# Patient Record
Sex: Female | Born: 1965 | Race: White | Hispanic: No | Smoking: Current every day smoker
Health system: Southern US, Community
[De-identification: ages and names within clinical notes are randomized; demographics above are authoritative.]

## PROBLEM LIST (undated history)

## (undated) DIAGNOSIS — K5730 Diverticulosis of large intestine without perforation or abscess without bleeding: Secondary | ICD-10-CM

## (undated) DIAGNOSIS — F32A Depression, unspecified: Secondary | ICD-10-CM

## (undated) DIAGNOSIS — E0500 Thyrotoxicosis with diffuse goiter without thyrotoxic crisis or storm: Secondary | ICD-10-CM

## (undated) DIAGNOSIS — T7840XA Allergy, unspecified, initial encounter: Secondary | ICD-10-CM

## (undated) DIAGNOSIS — F419 Anxiety disorder, unspecified: Secondary | ICD-10-CM

## (undated) DIAGNOSIS — H05829 Myopathy of extraocular muscles, unspecified orbit: Secondary | ICD-10-CM

## (undated) DIAGNOSIS — F329 Major depressive disorder, single episode, unspecified: Secondary | ICD-10-CM

## (undated) DIAGNOSIS — E039 Hypothyroidism, unspecified: Secondary | ICD-10-CM

## (undated) HISTORY — PX: TONSILLECTOMY AND ADENOIDECTOMY: SUR1326

## (undated) HISTORY — PX: ELBOW SURGERY: SHX618

## (undated) HISTORY — DX: Allergy, unspecified, initial encounter: T78.40XA

## (undated) HISTORY — PX: COLONOSCOPY: SHX174

## (undated) HISTORY — DX: Hypothyroidism, unspecified: E03.9

## (undated) HISTORY — PX: HAND SURGERY: SHX662

## (undated) HISTORY — PX: CERVIX LESION DESTRUCTION: SHX591

## (undated) HISTORY — PX: EYE MUSCLE SURGERY: SHX370

---

## 1998-10-27 ENCOUNTER — Other Ambulatory Visit: Admission: RE | Admit: 1998-10-27 | Discharge: 1998-10-27 | Payer: Self-pay | Admitting: Family Medicine

## 1999-11-26 ENCOUNTER — Ambulatory Visit (HOSPITAL_COMMUNITY): Admission: RE | Admit: 1999-11-26 | Discharge: 1999-11-26 | Payer: Self-pay | Admitting: Family Medicine

## 1999-11-26 ENCOUNTER — Encounter: Payer: Self-pay | Admitting: Family Medicine

## 1999-12-21 ENCOUNTER — Ambulatory Visit (HOSPITAL_COMMUNITY): Admission: RE | Admit: 1999-12-21 | Discharge: 1999-12-21 | Payer: Self-pay | Admitting: *Deleted

## 2000-01-16 ENCOUNTER — Ambulatory Visit (HOSPITAL_COMMUNITY): Admission: RE | Admit: 2000-01-16 | Discharge: 2000-01-16 | Payer: Self-pay | Admitting: *Deleted

## 2001-06-30 ENCOUNTER — Other Ambulatory Visit: Admission: RE | Admit: 2001-06-30 | Discharge: 2001-06-30 | Payer: Self-pay | Admitting: Family Medicine

## 2002-11-16 ENCOUNTER — Other Ambulatory Visit: Admission: RE | Admit: 2002-11-16 | Discharge: 2002-11-16 | Payer: Self-pay | Admitting: Family Medicine

## 2003-12-30 ENCOUNTER — Other Ambulatory Visit: Admission: RE | Admit: 2003-12-30 | Discharge: 2003-12-30 | Payer: Self-pay | Admitting: Family Medicine

## 2010-03-18 ENCOUNTER — Encounter: Payer: Self-pay | Admitting: Family Medicine

## 2011-01-02 ENCOUNTER — Ambulatory Visit (HOSPITAL_COMMUNITY)
Admission: RE | Admit: 2011-01-02 | Discharge: 2011-01-02 | Disposition: A | Discharge status: Home / Self Care | Payer: Medicare Other | Source: Ambulatory Visit | Attending: Orthopedic Surgery | Admitting: Orthopedic Surgery

## 2011-01-02 DIAGNOSIS — I8290 Acute embolism and thrombosis of unspecified vein: Secondary | ICD-10-CM

## 2011-01-02 DIAGNOSIS — M79609 Pain in unspecified limb: Secondary | ICD-10-CM

## 2011-01-16 ENCOUNTER — Other Ambulatory Visit (HOSPITAL_COMMUNITY): Payer: Self-pay | Admitting: Orthopedic Surgery

## 2011-01-16 DIAGNOSIS — I731 Thromboangiitis obliterans [Buerger's disease]: Secondary | ICD-10-CM

## 2011-01-21 ENCOUNTER — Other Ambulatory Visit: Payer: Self-pay | Admitting: Neurology

## 2011-01-21 MED ORDER — SODIUM CHLORIDE 0.9 % IV SOLN: INTRAVENOUS | Status: DC

## 2011-01-22 ENCOUNTER — Other Ambulatory Visit: Payer: Self-pay | Admitting: Radiology

## 2011-01-22 ENCOUNTER — Ambulatory Visit (HOSPITAL_COMMUNITY): Admission: RE | Admit: 2011-01-22 | Payer: Medicare Other | Source: Ambulatory Visit

## 2011-01-23 ENCOUNTER — Other Ambulatory Visit: Payer: Self-pay | Admitting: Radiology

## 2011-01-23 MED ORDER — SODIUM CHLORIDE 0.9 % IV SOLN: INTRAVENOUS | Status: DC

## 2011-01-24 ENCOUNTER — Encounter (HOSPITAL_COMMUNITY): Payer: Self-pay

## 2011-01-24 ENCOUNTER — Ambulatory Visit (HOSPITAL_COMMUNITY)
Admission: RE | Admit: 2011-01-24 | Discharge: 2011-01-24 | Disposition: A | Discharge status: Home / Self Care | Payer: Medicare Other | Source: Ambulatory Visit | Attending: Orthopedic Surgery | Admitting: Orthopedic Surgery

## 2011-01-24 ENCOUNTER — Other Ambulatory Visit (HOSPITAL_COMMUNITY): Payer: Self-pay | Admitting: Orthopedic Surgery

## 2011-01-24 DIAGNOSIS — I731 Thromboangiitis obliterans [Buerger's disease]: Secondary | ICD-10-CM

## 2011-01-24 DIAGNOSIS — R209 Unspecified disturbances of skin sensation: Secondary | ICD-10-CM | POA: Insufficient documentation

## 2011-01-24 DIAGNOSIS — F17200 Nicotine dependence, unspecified, uncomplicated: Secondary | ICD-10-CM | POA: Insufficient documentation

## 2011-01-24 DIAGNOSIS — Z01812 Encounter for preprocedural laboratory examination: Secondary | ICD-10-CM | POA: Insufficient documentation

## 2011-01-24 DIAGNOSIS — M79609 Pain in unspecified limb: Secondary | ICD-10-CM | POA: Insufficient documentation

## 2011-01-24 HISTORY — DX: Major depressive disorder, single episode, unspecified: F32.9

## 2011-01-24 HISTORY — DX: Depression, unspecified: F32.A

## 2011-01-24 HISTORY — DX: Anxiety disorder, unspecified: F41.9

## 2011-01-24 HISTORY — DX: Myopathy of extraocular muscles, unspecified orbit: H05.829

## 2011-01-24 HISTORY — DX: Diverticulosis of large intestine without perforation or abscess without bleeding: K57.30

## 2011-01-24 HISTORY — DX: Thyrotoxicosis with diffuse goiter without thyrotoxic crisis or storm: E05.00

## 2011-01-24 LAB — BASIC METABOLIC PANEL
GFR calc Af Amer: 66 mL/min — ABNORMAL LOW (ref >90)
GFR calc non Af Amer: 57 mL/min — ABNORMAL LOW (ref >90)
Glucose, Bld: 85 mg/dL (ref 70–99)
Potassium: 3.8 mEq/L (ref 3.5–5.1)
Sodium: 140 mEq/L (ref 135–145)

## 2011-01-24 LAB — DIFFERENTIAL
Lymphs Abs: 1.4 10*3/uL (ref 0.7–4.0)
Monocytes Relative: 8 % (ref 3–12)
Neutro Abs: 4.7 10*3/uL (ref 1.7–7.7)
Neutrophils Relative %: 70 % (ref 43–77)

## 2011-01-24 LAB — CBC
Hemoglobin: 12.7 g/dL (ref 12.0–15.0)
MCH: 32.2 pg (ref 26.0–34.0)
RBC: 3.94 MIL/uL (ref 3.87–5.11)

## 2011-01-24 LAB — PROTIME-INR: INR: 0.96 (ref 0.00–1.49)

## 2011-01-24 MED ORDER — SODIUM CHLORIDE 0.9 % IV SOLN
INTRAVENOUS | Status: AC | PRN
  Administered 2011-01-24: 50 mL/h via INTRAVENOUS

## 2011-01-24 MED ORDER — MIDAZOLAM HCL 5 MG/5ML IJ SOLN
INTRAMUSCULAR | Status: AC | PRN
  Administered 2011-01-24 (×2): 2 mg via INTRAVENOUS

## 2011-01-24 MED ORDER — FENTANYL CITRATE 0.05 MG/ML IJ SOLN
INTRAMUSCULAR | Status: AC | PRN
  Administered 2011-01-24 (×2): 50 ug via INTRAVENOUS

## 2011-01-24 MED ORDER — FENTANYL CITRATE 0.05 MG/ML IJ SOLN
INTRAMUSCULAR | Status: AC
  Filled 2011-01-24: qty 6

## 2011-01-24 MED ORDER — IOHEXOL 300 MG/ML  SOLN
200.0000 mL | Freq: Once | INTRAMUSCULAR | Status: AC | PRN
  Administered 2011-01-24: 60 mL via INTRA_ARTERIAL

## 2011-01-24 MED ORDER — NITROGLYCERIN 1 MG/10 ML FOR IR/CATH LAB
INTRA_ARTERIAL | Status: AC
  Filled 2011-01-24: qty 10

## 2011-01-24 MED ORDER — MIDAZOLAM HCL 2 MG/2ML IJ SOLN
INTRAMUSCULAR | Status: AC
  Filled 2011-01-24: qty 6

## 2011-01-24 NOTE — Procedures (Signed)
LUE arteriogram No complication No blood loss. See complete dictation in [facility].

## 2011-01-24 NOTE — Progress Notes (Signed)
Pt transferred via stretcher to SS C-6. Call bell in reach. Activity order/bedrest order explained to pt.

## 2011-01-24 NOTE — H&P (Signed)
Roberta Brooks is an 45 y.o. female.   Chief Complaint: 3 month history of pain, numbness, weakness and discoloration of the left 4th and 5th fingers. HPI: Long time smoker. Increasing symptoms of the hand and fingers within last 3 months.  Patient presents today for angiogram to evaluate for possible Buerger's disease.  Past Medical History  Diagnosis Date  . Grave's disease   . Diverticulosis of colon   . Eye muscle weakness   . Depression   . Anxiety     Past Surgical History  Procedure Date  . Tonsillectomy and adenoidectomy   . Eye muscle surgery   . Colonoscopy   . Cervix lesion destruction     No family history on file. Social History:  reports that she has been smoking.  She does not have any smokeless tobacco history on file. She reports that she does not drink alcohol or use illicit drugs.  Allergies: No Known Allergies  No current outpatient prescriptions on file as of 01/24/2011.   Medications Prior to Admission  Medication Dose Route Frequency Provider Last Rate Last Dose  . DISCONTD: 0.9 %  sodium chloride infusion   Intravenous Continuous Robet Leu, PA        Results for orders placed during the hospital encounter of 01/24/11 (from the past 48 hour(s))  APTT     Status: Normal   Collection Time   01/24/11  7:56 AM      Component Value Range Comment   aPTT 29  24 - 37 (seconds)   CBC     Status: Normal   Collection Time   01/24/11  7:56 AM      Component Value Range Comment   WBC 6.7  4.0 - 10.5 (K/uL)    RBC 3.94  3.87 - 5.11 (MIL/uL)    Hemoglobin 12.7  12.0 - 15.0 (g/dL)    HCT 16.1  09.6 - 04.5 (%)    MCV 94.9  78.0 - 100.0 (fL)    MCH 32.2  26.0 - 34.0 (pg)    MCHC 34.0  30.0 - 36.0 (g/dL)    RDW 40.9  81.1 - 91.4 (%)    Platelets 167  150 - 400 (K/uL)   DIFFERENTIAL     Status: Normal   Collection Time   01/24/11  7:56 AM      Component Value Range Comment   Neutrophils Relative 70  43 - 77 (%)    Neutro Abs 4.7  1.7 - 7.7 (K/uL)    Lymphocytes Relative 21  12 - 46 (%)    Lymphs Abs 1.4  0.7 - 4.0 (K/uL)    Monocytes Relative 8  3 - 12 (%)    Monocytes Absolute 0.6  0.1 - 1.0 (K/uL)    Eosinophils Relative 0  0 - 5 (%)    Eosinophils Absolute 0.0  0.0 - 0.7 (K/uL)    Basophils Relative 0  0 - 1 (%)    Basophils Absolute 0.0  0.0 - 0.1 (K/uL)   PROTIME-INR     Status: Normal   Collection Time   01/24/11  7:56 AM      Component Value Range Comment   Prothrombin Time 13.0  11.6 - 15.2 (seconds)    INR 0.96  0.00 - 1.49     No results found.  Review of Systems  Constitutional: Negative for fever, chills and malaise/fatigue.  HENT: Negative.   Eyes: Negative.   Respiratory: Negative.   Cardiovascular: Negative.  Gastrointestinal: Negative.   Genitourinary: Negative.   Musculoskeletal: Negative.   Skin: Negative.   Neurological: Positive for sensory change and focal weakness. Negative for weakness.       Involving left upper extremity  Endo/Heme/Allergies: Negative.   Psychiatric/Behavioral: Positive for depression.    Blood pressure 127/65, pulse 88, temperature 97.3 F (36.3 C), temperature source Oral, resp. rate 18, height 5\' 3"  (1.6 m), weight 115 lb (52.164 kg), SpO2 100.00%. Physical Exam  Constitutional: She is oriented to person, place, and time. She appears well-developed and well-nourished.  HENT:  Head: Normocephalic and atraumatic.  Eyes: EOM are normal. Pupils are equal, round, and reactive to light.  Neck: Normal range of motion.  Cardiovascular: Normal rate, regular rhythm and normal heart sounds.  Exam reveals no gallop and no friction rub.   No murmur heard. Respiratory: Effort normal and breath sounds normal. No respiratory distress. She has no wheezes. She has no rales.  GI: Soft. Bowel sounds are normal. She exhibits no distension. There is no tenderness.  Musculoskeletal: She exhibits tenderness. She exhibits no edema.       Left 4th and 5th fingers unable to extend completely,  slightly blue tinged, painful on exam with decreased sensation. Decreased pulses in ulnar artery, radial artery easily palpable.   Neurological: She is alert and oriented to person, place, and time.  Skin: Skin is warm and dry.  Psychiatric: She has a normal mood and affect. Her behavior is normal. Judgment and thought content normal.     Assessment/Plan Diminished ulnar artery with painful left hand worrisome for Buergers disease.  Patient has been counseled on smoking and it's effects if diagnosis determined on today's study.  Patient was informed of arteriogram procedure and risks associated with same including but not limited to infection, bleeding, contrast allergy, complications with moderate sedation and increased pain.  Patient's questions answered to her satisfaction and written consent obtained.  Labs all within normal limits to proceed.  Irmgard Rampersaud D 01/24/2011, 8:48 AM

## 2011-03-11 DIAGNOSIS — G609 Hereditary and idiopathic neuropathy, unspecified: Secondary | ICD-10-CM | POA: Diagnosis not present

## 2011-03-26 DIAGNOSIS — G5620 Lesion of ulnar nerve, unspecified upper limb: Secondary | ICD-10-CM | POA: Diagnosis not present

## 2011-03-28 DIAGNOSIS — N390 Urinary tract infection, site not specified: Secondary | ICD-10-CM | POA: Diagnosis not present

## 2011-03-28 DIAGNOSIS — E039 Hypothyroidism, unspecified: Secondary | ICD-10-CM | POA: Diagnosis not present

## 2011-03-28 DIAGNOSIS — E785 Hyperlipidemia, unspecified: Secondary | ICD-10-CM | POA: Diagnosis not present

## 2011-03-28 DIAGNOSIS — F3130 Bipolar disorder, current episode depressed, mild or moderate severity, unspecified: Secondary | ICD-10-CM | POA: Diagnosis not present

## 2011-04-03 DIAGNOSIS — G5620 Lesion of ulnar nerve, unspecified upper limb: Secondary | ICD-10-CM | POA: Diagnosis not present

## 2011-04-29 DIAGNOSIS — E890 Postprocedural hypothyroidism: Secondary | ICD-10-CM | POA: Diagnosis not present

## 2011-04-29 DIAGNOSIS — E0500 Thyrotoxicosis with diffuse goiter without thyrotoxic crisis or storm: Secondary | ICD-10-CM | POA: Diagnosis not present

## 2011-05-02 DIAGNOSIS — M24549 Contracture, unspecified hand: Secondary | ICD-10-CM | POA: Diagnosis not present

## 2011-05-02 DIAGNOSIS — M25649 Stiffness of unspecified hand, not elsewhere classified: Secondary | ICD-10-CM | POA: Diagnosis not present

## 2011-05-02 DIAGNOSIS — M79609 Pain in unspecified limb: Secondary | ICD-10-CM | POA: Diagnosis not present

## 2011-05-02 DIAGNOSIS — G5620 Lesion of ulnar nerve, unspecified upper limb: Secondary | ICD-10-CM | POA: Diagnosis not present

## 2011-05-06 DIAGNOSIS — G5620 Lesion of ulnar nerve, unspecified upper limb: Secondary | ICD-10-CM | POA: Diagnosis not present

## 2011-05-06 DIAGNOSIS — M79609 Pain in unspecified limb: Secondary | ICD-10-CM | POA: Diagnosis not present

## 2011-05-06 DIAGNOSIS — M24549 Contracture, unspecified hand: Secondary | ICD-10-CM | POA: Diagnosis not present

## 2011-05-06 DIAGNOSIS — M25649 Stiffness of unspecified hand, not elsewhere classified: Secondary | ICD-10-CM | POA: Diagnosis not present

## 2011-05-09 DIAGNOSIS — G5620 Lesion of ulnar nerve, unspecified upper limb: Secondary | ICD-10-CM | POA: Diagnosis not present

## 2011-05-09 DIAGNOSIS — M79609 Pain in unspecified limb: Secondary | ICD-10-CM | POA: Diagnosis not present

## 2011-05-09 DIAGNOSIS — M25649 Stiffness of unspecified hand, not elsewhere classified: Secondary | ICD-10-CM | POA: Diagnosis not present

## 2011-05-09 DIAGNOSIS — M24549 Contracture, unspecified hand: Secondary | ICD-10-CM | POA: Diagnosis not present

## 2011-05-14 DIAGNOSIS — M25649 Stiffness of unspecified hand, not elsewhere classified: Secondary | ICD-10-CM | POA: Diagnosis not present

## 2011-05-14 DIAGNOSIS — M79609 Pain in unspecified limb: Secondary | ICD-10-CM | POA: Diagnosis not present

## 2011-05-14 DIAGNOSIS — G5620 Lesion of ulnar nerve, unspecified upper limb: Secondary | ICD-10-CM | POA: Diagnosis not present

## 2011-05-14 DIAGNOSIS — M24549 Contracture, unspecified hand: Secondary | ICD-10-CM | POA: Diagnosis not present

## 2011-05-16 DIAGNOSIS — G5620 Lesion of ulnar nerve, unspecified upper limb: Secondary | ICD-10-CM | POA: Diagnosis not present

## 2011-05-16 DIAGNOSIS — M79609 Pain in unspecified limb: Secondary | ICD-10-CM | POA: Diagnosis not present

## 2011-05-16 DIAGNOSIS — M25649 Stiffness of unspecified hand, not elsewhere classified: Secondary | ICD-10-CM | POA: Diagnosis not present

## 2011-05-16 DIAGNOSIS — M24549 Contracture, unspecified hand: Secondary | ICD-10-CM | POA: Diagnosis not present

## 2011-05-23 DIAGNOSIS — M25649 Stiffness of unspecified hand, not elsewhere classified: Secondary | ICD-10-CM | POA: Diagnosis not present

## 2011-05-23 DIAGNOSIS — M79609 Pain in unspecified limb: Secondary | ICD-10-CM | POA: Diagnosis not present

## 2011-05-23 DIAGNOSIS — G5620 Lesion of ulnar nerve, unspecified upper limb: Secondary | ICD-10-CM | POA: Diagnosis not present

## 2011-05-23 DIAGNOSIS — M24549 Contracture, unspecified hand: Secondary | ICD-10-CM | POA: Diagnosis not present

## 2011-05-27 DIAGNOSIS — G5620 Lesion of ulnar nerve, unspecified upper limb: Secondary | ICD-10-CM | POA: Diagnosis not present

## 2011-05-27 DIAGNOSIS — M24549 Contracture, unspecified hand: Secondary | ICD-10-CM | POA: Diagnosis not present

## 2011-05-27 DIAGNOSIS — M25649 Stiffness of unspecified hand, not elsewhere classified: Secondary | ICD-10-CM | POA: Diagnosis not present

## 2011-05-27 DIAGNOSIS — M79609 Pain in unspecified limb: Secondary | ICD-10-CM | POA: Diagnosis not present

## 2011-05-30 DIAGNOSIS — M25649 Stiffness of unspecified hand, not elsewhere classified: Secondary | ICD-10-CM | POA: Diagnosis not present

## 2011-05-30 DIAGNOSIS — M24549 Contracture, unspecified hand: Secondary | ICD-10-CM | POA: Diagnosis not present

## 2011-05-30 DIAGNOSIS — G5620 Lesion of ulnar nerve, unspecified upper limb: Secondary | ICD-10-CM | POA: Diagnosis not present

## 2011-05-30 DIAGNOSIS — M79609 Pain in unspecified limb: Secondary | ICD-10-CM | POA: Diagnosis not present

## 2011-06-04 DIAGNOSIS — M25649 Stiffness of unspecified hand, not elsewhere classified: Secondary | ICD-10-CM | POA: Diagnosis not present

## 2011-06-04 DIAGNOSIS — M24549 Contracture, unspecified hand: Secondary | ICD-10-CM | POA: Diagnosis not present

## 2011-06-04 DIAGNOSIS — G5620 Lesion of ulnar nerve, unspecified upper limb: Secondary | ICD-10-CM | POA: Diagnosis not present

## 2011-06-04 DIAGNOSIS — M79609 Pain in unspecified limb: Secondary | ICD-10-CM | POA: Diagnosis not present

## 2011-06-10 DIAGNOSIS — M79609 Pain in unspecified limb: Secondary | ICD-10-CM | POA: Diagnosis not present

## 2011-06-10 DIAGNOSIS — G5620 Lesion of ulnar nerve, unspecified upper limb: Secondary | ICD-10-CM | POA: Diagnosis not present

## 2011-06-10 DIAGNOSIS — M24549 Contracture, unspecified hand: Secondary | ICD-10-CM | POA: Diagnosis not present

## 2011-06-10 DIAGNOSIS — M25649 Stiffness of unspecified hand, not elsewhere classified: Secondary | ICD-10-CM | POA: Diagnosis not present

## 2011-07-05 DIAGNOSIS — G5620 Lesion of ulnar nerve, unspecified upper limb: Secondary | ICD-10-CM | POA: Diagnosis not present

## 2011-07-09 DIAGNOSIS — E039 Hypothyroidism, unspecified: Secondary | ICD-10-CM | POA: Diagnosis not present

## 2011-07-09 DIAGNOSIS — N3942 Incontinence without sensory awareness: Secondary | ICD-10-CM | POA: Diagnosis not present

## 2011-07-09 DIAGNOSIS — R3589 Other polyuria: Secondary | ICD-10-CM | POA: Diagnosis not present

## 2011-07-09 DIAGNOSIS — R358 Other polyuria: Secondary | ICD-10-CM | POA: Diagnosis not present

## 2011-08-26 DIAGNOSIS — N3946 Mixed incontinence: Secondary | ICD-10-CM | POA: Diagnosis not present

## 2011-08-30 DIAGNOSIS — G5620 Lesion of ulnar nerve, unspecified upper limb: Secondary | ICD-10-CM | POA: Diagnosis not present

## 2011-09-05 DIAGNOSIS — N3946 Mixed incontinence: Secondary | ICD-10-CM | POA: Diagnosis not present

## 2011-09-26 DIAGNOSIS — N3946 Mixed incontinence: Secondary | ICD-10-CM | POA: Diagnosis not present

## 2011-11-05 DIAGNOSIS — E0500 Thyrotoxicosis with diffuse goiter without thyrotoxic crisis or storm: Secondary | ICD-10-CM | POA: Diagnosis not present

## 2011-11-05 DIAGNOSIS — E890 Postprocedural hypothyroidism: Secondary | ICD-10-CM | POA: Diagnosis not present

## 2011-11-07 DIAGNOSIS — E0500 Thyrotoxicosis with diffuse goiter without thyrotoxic crisis or storm: Secondary | ICD-10-CM | POA: Diagnosis not present

## 2011-11-07 DIAGNOSIS — E890 Postprocedural hypothyroidism: Secondary | ICD-10-CM | POA: Diagnosis not present

## 2011-11-13 DIAGNOSIS — N3946 Mixed incontinence: Secondary | ICD-10-CM | POA: Diagnosis not present

## 2011-11-20 DIAGNOSIS — N3644 Muscular disorders of urethra: Secondary | ICD-10-CM | POA: Diagnosis not present

## 2011-11-20 DIAGNOSIS — N318 Other neuromuscular dysfunction of bladder: Secondary | ICD-10-CM | POA: Diagnosis not present

## 2011-11-20 DIAGNOSIS — N3946 Mixed incontinence: Secondary | ICD-10-CM | POA: Diagnosis not present

## 2011-12-30 ENCOUNTER — Ambulatory Visit: Payer: Medicare Other | Attending: Urology | Admitting: Physical Therapy

## 2011-12-30 DIAGNOSIS — M2420 Disorder of ligament, unspecified site: Secondary | ICD-10-CM | POA: Insufficient documentation

## 2011-12-30 DIAGNOSIS — IMO0001 Reserved for inherently not codable concepts without codable children: Secondary | ICD-10-CM | POA: Insufficient documentation

## 2011-12-30 DIAGNOSIS — M629 Disorder of muscle, unspecified: Secondary | ICD-10-CM | POA: Insufficient documentation

## 2012-01-14 ENCOUNTER — Ambulatory Visit: Payer: Medicare Other | Admitting: Physical Therapy

## 2012-01-14 DIAGNOSIS — M629 Disorder of muscle, unspecified: Secondary | ICD-10-CM | POA: Diagnosis not present

## 2012-01-14 DIAGNOSIS — IMO0001 Reserved for inherently not codable concepts without codable children: Secondary | ICD-10-CM | POA: Diagnosis not present

## 2012-01-15 ENCOUNTER — Ambulatory Visit: Payer: Medicare Other | Admitting: Physical Therapy

## 2012-01-16 ENCOUNTER — Ambulatory Visit: Payer: Medicare Other | Admitting: Physical Therapy

## 2012-01-20 ENCOUNTER — Ambulatory Visit: Payer: Medicare Other | Admitting: Physical Therapy

## 2012-01-21 ENCOUNTER — Ambulatory Visit: Payer: Medicare Other | Admitting: Physical Therapy

## 2012-01-21 DIAGNOSIS — R05 Cough: Secondary | ICD-10-CM | POA: Diagnosis not present

## 2012-01-21 DIAGNOSIS — R059 Cough, unspecified: Secondary | ICD-10-CM | POA: Diagnosis not present

## 2012-01-21 DIAGNOSIS — J069 Acute upper respiratory infection, unspecified: Secondary | ICD-10-CM | POA: Diagnosis not present

## 2012-01-27 ENCOUNTER — Ambulatory Visit: Payer: Medicare Other | Attending: Urology | Admitting: Physical Therapy

## 2012-01-27 DIAGNOSIS — M2420 Disorder of ligament, unspecified site: Secondary | ICD-10-CM | POA: Insufficient documentation

## 2012-01-27 DIAGNOSIS — M629 Disorder of muscle, unspecified: Secondary | ICD-10-CM | POA: Diagnosis not present

## 2012-01-27 DIAGNOSIS — IMO0001 Reserved for inherently not codable concepts without codable children: Secondary | ICD-10-CM | POA: Insufficient documentation

## 2012-01-29 ENCOUNTER — Ambulatory Visit: Payer: Medicare Other | Admitting: Physical Therapy

## 2012-02-03 ENCOUNTER — Ambulatory Visit: Payer: Medicare Other | Admitting: Physical Therapy

## 2012-02-05 ENCOUNTER — Encounter: Payer: Medicare Other | Admitting: Physical Therapy

## 2012-02-10 ENCOUNTER — Encounter: Payer: Medicare Other | Admitting: Physical Therapy

## 2012-02-12 ENCOUNTER — Encounter: Payer: Medicare Other | Admitting: Physical Therapy

## 2012-02-17 ENCOUNTER — Encounter: Payer: Medicare Other | Admitting: Physical Therapy

## 2012-02-18 ENCOUNTER — Encounter: Payer: Medicare Other | Admitting: Physical Therapy

## 2012-02-24 ENCOUNTER — Encounter: Payer: Medicare Other | Admitting: Physical Therapy

## 2012-03-30 DIAGNOSIS — E559 Vitamin D deficiency, unspecified: Secondary | ICD-10-CM | POA: Diagnosis not present

## 2012-03-30 DIAGNOSIS — R5383 Other fatigue: Secondary | ICD-10-CM | POA: Diagnosis not present

## 2012-03-30 DIAGNOSIS — R109 Unspecified abdominal pain: Secondary | ICD-10-CM | POA: Diagnosis not present

## 2012-03-30 DIAGNOSIS — K2900 Acute gastritis without bleeding: Secondary | ICD-10-CM | POA: Diagnosis not present

## 2012-03-30 DIAGNOSIS — D510 Vitamin B12 deficiency anemia due to intrinsic factor deficiency: Secondary | ICD-10-CM | POA: Diagnosis not present

## 2012-03-30 DIAGNOSIS — R5381 Other malaise: Secondary | ICD-10-CM | POA: Diagnosis not present

## 2012-03-30 DIAGNOSIS — E785 Hyperlipidemia, unspecified: Secondary | ICD-10-CM | POA: Diagnosis not present

## 2012-03-30 DIAGNOSIS — F3289 Other specified depressive episodes: Secondary | ICD-10-CM | POA: Diagnosis not present

## 2012-03-30 DIAGNOSIS — F329 Major depressive disorder, single episode, unspecified: Secondary | ICD-10-CM | POA: Diagnosis not present

## 2012-07-01 ENCOUNTER — Other Ambulatory Visit: Payer: Self-pay

## 2012-07-01 MED ORDER — ALPRAZOLAM 0.5 MG PO TABS: 0.5000 mg | ORAL_TABLET | Freq: Three times a day (TID) | ORAL | Status: DC | PRN

## 2012-07-01 NOTE — Telephone Encounter (Signed)
Last written 04/21/12  Last seen 03/30/12   If approved call in and have nurse notify patient

## 2012-07-01 NOTE — Telephone Encounter (Signed)
Please call in RX for xanax 

## 2012-07-01 NOTE — Telephone Encounter (Signed)
Med called in pt aware

## 2012-08-06 ENCOUNTER — Other Ambulatory Visit: Payer: Self-pay | Admitting: Nurse Practitioner

## 2012-08-25 ENCOUNTER — Other Ambulatory Visit: Payer: Self-pay | Admitting: Nurse Practitioner

## 2012-08-25 NOTE — Telephone Encounter (Signed)
Last seen 03/30/12   MMM If approved print and have nurse call patient to pick up

## 2012-08-25 NOTE — Telephone Encounter (Signed)
rx ready for pickup 

## 2012-08-26 NOTE — Telephone Encounter (Signed)
[facility] 

## 2012-09-10 ENCOUNTER — Other Ambulatory Visit: Payer: Self-pay | Admitting: Nurse Practitioner

## 2012-09-11 ENCOUNTER — Other Ambulatory Visit: Payer: Self-pay | Admitting: Nurse Practitioner

## 2012-09-11 NOTE — Telephone Encounter (Signed)
Last TSH said to recheck in 6 weeks, that was 04/09/12 and pt has not been back?

## 2012-09-16 ENCOUNTER — Ambulatory Visit (INDEPENDENT_AMBULATORY_CARE_PROVIDER_SITE_OTHER): Payer: Medicare Other | Admitting: Family Medicine

## 2012-09-16 ENCOUNTER — Encounter: Payer: Self-pay | Admitting: Family Medicine

## 2012-09-16 VITALS — BP 109/70 | HR 81 | Temp 97.7°F | Ht 64.0 in | Wt 89.0 lb

## 2012-09-16 DIAGNOSIS — F3289 Other specified depressive episodes: Secondary | ICD-10-CM | POA: Diagnosis not present

## 2012-09-16 DIAGNOSIS — R232 Flushing: Secondary | ICD-10-CM

## 2012-09-16 DIAGNOSIS — F32A Depression, unspecified: Secondary | ICD-10-CM | POA: Insufficient documentation

## 2012-09-16 DIAGNOSIS — E039 Hypothyroidism, unspecified: Secondary | ICD-10-CM

## 2012-09-16 DIAGNOSIS — N951 Menopausal and female climacteric states: Secondary | ICD-10-CM | POA: Diagnosis not present

## 2012-09-16 DIAGNOSIS — R5381 Other malaise: Secondary | ICD-10-CM

## 2012-09-16 DIAGNOSIS — N912 Amenorrhea, unspecified: Secondary | ICD-10-CM | POA: Diagnosis not present

## 2012-09-16 DIAGNOSIS — R5383 Other fatigue: Secondary | ICD-10-CM | POA: Diagnosis not present

## 2012-09-16 DIAGNOSIS — F329 Major depressive disorder, single episode, unspecified: Secondary | ICD-10-CM | POA: Diagnosis not present

## 2012-09-16 HISTORY — DX: Hypothyroidism, unspecified: E03.9

## 2012-09-16 LAB — POCT CBC
Granulocyte percent: 61.9 %G (ref 37–80)
HCT, POC: 42.4 % (ref 37.7–47.9)
Hemoglobin: 14.4 g/dL (ref 12.2–16.2)
Lymph, poc: 2.1 (ref 0.6–3.4)
MCH, POC: 32.8 pg — AB (ref 27–31.2)
MCHC: 34 g/dL (ref 31.8–35.4)
MCV: 96.4 fL (ref 80–97)
MPV: 8.1 fL (ref 0–99.8)
POC Granulocyte: 3.7 (ref 2–6.9)
POC LYMPH PERCENT: 34.7 %L (ref 10–50)
Platelet Count, POC: 158 10*3/uL (ref 142–424)
RBC: 4.4 M/uL (ref 4.04–5.48)
RDW, POC: 12.4 %
WBC: 6 10*3/uL (ref 4.6–10.2)

## 2012-09-16 MED ORDER — LEVOTHYROXINE SODIUM 75 MCG PO TABS: 75.0000 ug | ORAL_TABLET | Freq: Every day | ORAL | Status: DC

## 2012-09-16 MED ORDER — VENLAFAXINE HCL ER 37.5 MG PO CP24: ORAL_CAPSULE | ORAL | Status: DC

## 2012-09-16 MED ORDER — VENLAFAXINE HCL ER 75 MG PO CP24: 75.0000 mg | ORAL_CAPSULE | Freq: Every day | ORAL | Status: DC
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
## 2012-09-16 NOTE — Patient Instructions (Addendum)
Depression, Adult Depression refers to feeling sad, low, down in the dumps, blue, gloomy, or empty. In general, there are two kinds of depression: 1. Depression that we all experience from time to time because of upsetting life experiences, including the loss of a job or the ending of a relationship (normal sadness or normal grief). This kind of depression is considered normal, is short lived, and resolves within a few days to 2 weeks. (Depression experienced after the loss of a loved one is called bereavement. Bereavement often lasts longer than 2 weeks but normally gets better with time.) 2. Clinical depression, which lasts longer than normal sadness or normal grief or interferes with your ability to function at home, at work, and in school. It also interferes with your personal relationships. It affects almost every aspect of your life. Clinical depression is an illness. Symptoms of depression also can be caused by conditions other than normal sadness and grief or clinical depression. Examples of these conditions are listed as follows:  Physical illness Some physical illnesses, including underactive thyroid gland (hypothyroidism), severe anemia, specific types of cancer, diabetes, uncontrolled seizures, heart and lung problems, strokes, and chronic pain are commonly associated with symptoms of depression.  Side effects of some prescription medicine In some people, certain types of prescription medicine can cause symptoms of depression.  Substance abuse Abuse of alcohol and illicit drugs can cause symptoms of depression. SYMPTOMS Symptoms of normal sadness and normal grief include the following:  Feeling sad or crying for short periods of time.  Not caring about anything (apathy).  Difficulty sleeping or sleeping too much.  No longer able to enjoy the things you used to enjoy.  Desire to be by oneself all the time (social isolation).  Lack of energy or motivation.  Difficulty  concentrating or remembering.  Change in appetite or weight.  Restlessness or agitation. Symptoms of clinical depression include the same symptoms of normal sadness or normal grief and also the following symptoms:  Feeling sad or crying all the time.  Feelings of guilt or worthlessness.  Feelings of hopelessness or helplessness.  Thoughts of suicide or the desire to harm yourself (suicidal ideation).  Loss of touch with reality (psychotic symptoms). Seeing or hearing things that are not real (hallucinations) or having false beliefs about your life or the people around you (delusions and paranoia). DIAGNOSIS  The diagnosis of clinical depression usually is based on the severity and duration of the symptoms. Your caregiver also will ask you questions about your medical history and substance use to find out if physical illness, use of prescription medicine, or substance abuse is causing your depression. Your caregiver also may order blood tests. TREATMENT  Typically, normal sadness and normal grief do not require treatment. However, sometimes antidepressant medicine is prescribed for bereavement to ease the depressive symptoms until they resolve. The treatment for clinical depression depends on the severity of your symptoms but typically includes antidepressant medicine, counseling with a mental health professional, or a combination of both. Your caregiver will help to determine what treatment is best for you. Depression caused by physical illness usually goes away with appropriate medical treatment of the illness. If prescription medicine is causing depression, talk with your caregiver about stopping the medicine, decreasing the dose, or substituting another medicine. Depression caused by abuse of alcohol or illicit drugs abuse goes away with abstinence from these substances. Some adults need professional help in order to stop drinking or using drugs. SEEK IMMEDIATE CARE IF:  You have   thoughts  about hurting yourself or others.  You lose touch with reality (have psychotic symptoms).  You are taking medicine for depression and have a serious side effect. FOR MORE INFORMATION National Alliance on Mental Illness: www.nami.[employer]: http://www.maynard.net/ Document Released: 02/09/2000 Document Revised: 08/13/2011 Document Reviewed: 05/13/2011 [facility] Patient Information 2014 [place], [place]. Depression, Adult Depression refers to feeling sad, low, down in the dumps, blue, gloomy, or empty. In general, there are two kinds of depression: 3. Depression that we all experience from time to time because of upsetting life experiences, including the loss of a job or the ending of a relationship (normal sadness or normal grief). This kind of depression is considered normal, is short lived, and resolves within a few days to 2 weeks. (Depression experienced after the loss of a loved one is called bereavement. Bereavement often lasts longer than 2 weeks but normally gets better with time.) 4. Clinical depression, which lasts longer than normal sadness or normal grief or interferes with your ability to function at home, at work, and in school. It also interferes with your personal relationships. It affects almost every aspect of your life. Clinical depression is an illness. Symptoms of depression also can be caused by conditions other than normal sadness and grief or clinical depression. Examples of these conditions are listed as follows:  Physical illness Some physical illnesses, including underactive thyroid gland (hypothyroidism), severe anemia, specific types of cancer, diabetes, uncontrolled seizures, heart and lung problems, strokes, and chronic pain are commonly associated with symptoms of depression.  Side effects of some prescription medicine In some people, certain types of prescription medicine can cause symptoms of depression.  Substance abuse Abuse of alcohol and  illicit drugs can cause symptoms of depression. SYMPTOMS Symptoms of normal sadness and normal grief include the following:  Feeling sad or crying for short periods of time.  Not caring about anything (apathy).  Difficulty sleeping or sleeping too much.  No longer able to enjoy the things you used to enjoy.  Desire to be by oneself all the time (social isolation).  Lack of energy or motivation.  Difficulty concentrating or remembering.  Change in appetite or weight.  Restlessness or agitation. Symptoms of clinical depression include the same symptoms of normal sadness or normal grief and also the following symptoms:  Feeling sad or crying all the time.  Feelings of guilt or worthlessness.  Feelings of hopelessness or helplessness.  Thoughts of suicide or the desire to harm yourself (suicidal ideation).  Loss of touch with reality (psychotic symptoms). Seeing or hearing things that are not real (hallucinations) or having false beliefs about your life or the people around you (delusions and paranoia). DIAGNOSIS  The diagnosis of clinical depression usually is based on the severity and duration of the symptoms. Your caregiver also will ask you questions about your medical history and substance use to find out if physical illness, use of prescription medicine, or substance abuse is causing your depression. Your caregiver also may order blood tests. TREATMENT  Typically, normal sadness and normal grief do not require treatment. However, sometimes antidepressant medicine is prescribed for bereavement to ease the depressive symptoms until they resolve. The treatment for clinical depression depends on the severity of your symptoms but typically includes antidepressant medicine, counseling with a mental health professional, or a combination of both. Your caregiver will help to determine what treatment is best for you. Depression caused by physical illness usually goes away with  appropriate medical treatment of  the illness. If prescription medicine is causing depression, talk with your caregiver about stopping the medicine, decreasing the dose, or substituting another medicine. Depression caused by abuse of alcohol or illicit drugs abuse goes away with abstinence from these substances. Some adults need professional help in order to stop drinking or using drugs. SEEK IMMEDIATE CARE IF:  You have thoughts about hurting yourself or others.  You lose touch with reality (have psychotic symptoms).  You are taking medicine for depression and have a serious side effect. FOR MORE INFORMATION National Alliance on Mental Illness: www.nami.[employer]: http://www.maynard.net/ Document Released: 02/09/2000 Document Revised: 08/13/2011 Document Reviewed: 05/13/2011 [facility] Patient Information 2014 [place], [place].

## 2012-09-17 LAB — COMPLETE METABOLIC PANEL WITH GFR
ALT: 11 U/L (ref 0–35)
AST: 18 U/L (ref 0–37)
Albumin: 4.6 g/dL (ref 3.5–5.2)
Alkaline Phosphatase: 61 U/L (ref 39–117)
BUN: 19 mg/dL (ref 6–23)
CO2: 28 mEq/L (ref 19–32)
Calcium: 9.9 mg/dL (ref 8.4–10.5)
Chloride: 104 mEq/L (ref 96–112)
Creat: 1.16 mg/dL — ABNORMAL HIGH (ref 0.50–1.10)
GFR, Est African American: 64 mL/min
GFR, Est Non African American: 56 mL/min — ABNORMAL LOW
Glucose, Bld: 84 mg/dL (ref 70–99)
Potassium: 4.2 mEq/L (ref 3.5–5.3)
Sodium: 141 mEq/L (ref 135–145)
Total Bilirubin: 0.7 mg/dL (ref 0.3–1.2)
Total Protein: 7.1 g/dL (ref 6.0–8.3)

## 2012-09-17 LAB — THYROID PANEL WITH TSH
Free Thyroxine Index: 4.3 — ABNORMAL HIGH (ref 1.0–3.9)
T3 Uptake: 32.7 % (ref 22.5–37.0)
T4, Total: 13.2 ug/dL — ABNORMAL HIGH (ref 5.0–12.5)
TSH: 0.155 u[IU]/mL — ABNORMAL LOW (ref 0.350–4.500)

## 2012-09-17 LAB — FOLLICLE STIMULATING HORMONE: FSH: 142 m[IU]/mL — ABNORMAL HIGH

## 2012-09-18 ENCOUNTER — Other Ambulatory Visit: Payer: Self-pay | Admitting: Family Medicine

## 2012-09-18 DIAGNOSIS — E039 Hypothyroidism, unspecified: Secondary | ICD-10-CM

## 2012-09-18 MED ORDER — LEVOTHYROXINE SODIUM 50 MCG PO TABS: 50.0000 ug | ORAL_TABLET | Freq: Every day | ORAL | Status: DC

## 2012-09-21 ENCOUNTER — Telehealth: Payer: Self-pay | Admitting: Family Medicine

## 2012-09-22 NOTE — Telephone Encounter (Signed)
Patient aware please review FSH and tell her if she in in menopause

## 2012-09-22 NOTE — Telephone Encounter (Signed)
FSH is high so she is menopausal.  Also I adjusted her thyroid medicine and she needs to follow up 6 weeks after taking new thyroid medicine in 6 weeks to repeat TSH.

## 2012-09-23 NOTE — Telephone Encounter (Signed)
Patient aware.

## 2012-11-08 ENCOUNTER — Other Ambulatory Visit: Payer: Self-pay | Admitting: Nurse Practitioner

## 2012-11-25 ENCOUNTER — Other Ambulatory Visit: Payer: Self-pay | Admitting: Nurse Practitioner

## 2012-11-27 ENCOUNTER — Other Ambulatory Visit: Payer: Self-pay | Admitting: Family Medicine

## 2012-11-27 MED ORDER — ALPRAZOLAM 0.5 MG PO TABS: 0.5000 mg | ORAL_TABLET | Freq: Three times a day (TID) | ORAL | Status: DC | PRN

## 2012-11-27 NOTE — Telephone Encounter (Signed)
Last filled 10/26/12, last seen 08/27/12. Route to Pool A so [facility]

## 2012-11-27 NOTE — Telephone Encounter (Signed)
Left authorization on pharmacy voicemail. 

## 2012-12-02 ENCOUNTER — Telehealth: Payer: Self-pay | Admitting: Nurse Practitioner

## 2012-12-02 ENCOUNTER — Other Ambulatory Visit: Payer: Self-pay | Admitting: Nurse Practitioner

## 2012-12-02 MED ORDER — HYDROXYZINE HCL 25 MG PO TABS: 25.0000 mg | ORAL_TABLET | Freq: Three times a day (TID) | ORAL | Status: DC | PRN

## 2012-12-02 NOTE — Telephone Encounter (Signed)
Called in.

## 2012-12-02 NOTE — Telephone Encounter (Signed)
Hydroxyzine rx sent to pharmacy

## 2012-12-15 ENCOUNTER — Ambulatory Visit (INDEPENDENT_AMBULATORY_CARE_PROVIDER_SITE_OTHER): Payer: Medicare Other | Admitting: Family Medicine

## 2012-12-15 VITALS — BP 128/63 | HR 70 | Temp 98.6°F | Ht 64.0 in | Wt 95.0 lb

## 2012-12-15 DIAGNOSIS — R21 Rash and other nonspecific skin eruption: Secondary | ICD-10-CM | POA: Diagnosis not present

## 2012-12-15 DIAGNOSIS — M79641 Pain in right hand: Secondary | ICD-10-CM

## 2012-12-15 DIAGNOSIS — M79609 Pain in unspecified limb: Secondary | ICD-10-CM | POA: Diagnosis not present

## 2012-12-15 DIAGNOSIS — T148 Other injury of unspecified body region: Secondary | ICD-10-CM | POA: Diagnosis not present

## 2012-12-15 DIAGNOSIS — W57XXXA Bitten or stung by nonvenomous insect and other nonvenomous arthropods, initial encounter: Secondary | ICD-10-CM

## 2012-12-15 MED ORDER — DOXYCYCLINE HYCLATE 100 MG PO CAPS: 100.0000 mg | ORAL_CAPSULE | Freq: Two times a day (BID) | ORAL | Status: DC

## 2012-12-15 MED ORDER — PREDNISONE 50 MG PO TABS: ORAL_TABLET | ORAL | Status: DC

## 2012-12-15 NOTE — Progress Notes (Signed)
  Subjective:    Patient ID: Roberta Brooks, female    DOB: [birth year], 47 y.o.   MRN: [id number]  HPI HPI  This patient complains of a RASH  Location: upper extremities, hands, legs   Onset: 3-4 days   Course: Had known heavy flea exposure. Developed redness, pruritis, mild pain after insect bites. Also with mild R hand pain. Had grandchildren with similar lesions after flea exposure. Works on farm.   Self-treated with: nothing   Improvement with treatment: nothing   History  Itching: yes  Tenderness: mild  New medications/antibiotics: no  Pet exposure: works outdoors   Recent travel or tropical exposure: no  New soaps, shampoos, detergent, clothing: no  Tick/insect exposure: no  Chemical Exposure: no  Red Flags  Feeling ill: no  Fever: no  Facial/tongue swelling/difficulty breathing: no  Diabetic or immunocompromised: no      Review of Systems  All other systems reviewed and are negative.       Objective:   Physical Exam  Constitutional:  Underweight   HENT:  Head: Normocephalic and atraumatic.  Eyes: Conjunctivae are normal. Pupils are equal, round, and reactive to light.  Neck: Normal range of motion.  Cardiovascular: Normal rate and regular rhythm.   Pulmonary/Chest: Effort normal and breath sounds normal.  Abdominal: Soft.  Neurological: She is alert.  Skin: Rash noted.     Multiple focal erythematous lesions on upper extremities, hands, torso  + mild TTP on dorsum of R hand Mild pain with resisted supination/pronation           Assessment & Plan:  Right hand pain - Plan: DG Hand Complete Right  Rash and nonspecific skin eruption - Plan: predniSONE (DELTASONE) 50 MG tablet, doxycycline (VIBRAMYCIN) 100 MG capsule  Insect bites - Plan: doxycycline (VIBRAMYCIN) 100 MG capsule  Will place on prednisone and doxy for inflammatory and infectious coverage.  R hand xray pending. Would like to rule out any fracture or secondary inflammatory changes. Full  ROM on exam today.  Discussed infectious and derm red flags that would warrant reevaluation.  Follow up as needed.

## 2012-12-16 ENCOUNTER — Other Ambulatory Visit (INDEPENDENT_AMBULATORY_CARE_PROVIDER_SITE_OTHER): Payer: Medicare Other

## 2012-12-16 DIAGNOSIS — M79609 Pain in unspecified limb: Secondary | ICD-10-CM

## 2012-12-16 DIAGNOSIS — M79641 Pain in right hand: Secondary | ICD-10-CM

## 2012-12-17 ENCOUNTER — Telehealth: Payer: Self-pay | Admitting: Nurse Practitioner

## 2012-12-17 MED ORDER — PYRETHRINS-PIPERONYL BUTOXIDE 0.3-3 % EX SHAM: 1.0000 "application " | MEDICATED_SHAMPOO | Freq: Once | CUTANEOUS | Status: DC

## 2012-12-17 NOTE — Telephone Encounter (Signed)
Cannot use RID while pregnant- ask OB what can be used

## 2012-12-30 ENCOUNTER — Other Ambulatory Visit: Payer: Self-pay | Admitting: Family Medicine

## 2012-12-31 ENCOUNTER — Other Ambulatory Visit: Payer: Self-pay

## 2012-12-31 NOTE — Telephone Encounter (Signed)
Last seen 09/16/12, last filled 11/27/12, Route to pool A [facility]

## 2013-01-01 ENCOUNTER — Other Ambulatory Visit: Payer: Self-pay | Admitting: Family Medicine

## 2013-01-01 MED ORDER — ALPRAZOLAM 0.5 MG PO TABS: 0.5000 mg | ORAL_TABLET | Freq: Three times a day (TID) | ORAL | Status: DC | PRN

## 2013-01-01 NOTE — Telephone Encounter (Signed)
Last seen 12/15/12  Dr Alvester Morin   If approved route to nurse [facility]

## 2013-01-05 ENCOUNTER — Telehealth: Payer: Self-pay | Admitting: Nurse Practitioner

## 2013-01-05 NOTE — Telephone Encounter (Signed)
Was filled on 01/01/13 by Ander Slade

## 2013-01-05 NOTE — Telephone Encounter (Signed)
Pt notified [facility]ax [facility]

## 2013-01-20 ENCOUNTER — Ambulatory Visit (INDEPENDENT_AMBULATORY_CARE_PROVIDER_SITE_OTHER): Payer: Medicare Other | Admitting: Nurse Practitioner

## 2013-01-20 ENCOUNTER — Encounter: Payer: Self-pay | Admitting: Nurse Practitioner

## 2013-01-20 VITALS — BP 122/73 | HR 96 | Temp 96.5°F | Ht 64.0 in | Wt 97.0 lb

## 2013-01-20 DIAGNOSIS — Z0000 Encounter for general adult medical examination without abnormal findings: Secondary | ICD-10-CM | POA: Diagnosis not present

## 2013-01-20 DIAGNOSIS — F3289 Other specified depressive episodes: Secondary | ICD-10-CM | POA: Diagnosis not present

## 2013-01-20 DIAGNOSIS — Z124 Encounter for screening for malignant neoplasm of cervix: Secondary | ICD-10-CM | POA: Diagnosis not present

## 2013-01-20 DIAGNOSIS — F329 Major depressive disorder, single episode, unspecified: Secondary | ICD-10-CM

## 2013-01-20 DIAGNOSIS — Z23 Encounter for immunization: Secondary | ICD-10-CM

## 2013-01-20 DIAGNOSIS — T148XXA Other injury of unspecified body region, initial encounter: Secondary | ICD-10-CM

## 2013-01-20 DIAGNOSIS — Z01419 Encounter for gynecological examination (general) (routine) without abnormal findings: Secondary | ICD-10-CM | POA: Diagnosis not present

## 2013-01-20 DIAGNOSIS — F32A Depression, unspecified: Secondary | ICD-10-CM

## 2013-01-20 DIAGNOSIS — E039 Hypothyroidism, unspecified: Secondary | ICD-10-CM

## 2013-01-20 LAB — POCT URINALYSIS DIPSTICK
Bilirubin, UA: NEGATIVE
Blood, UA: NEGATIVE
Glucose, UA: NEGATIVE
Nitrite, UA: NEGATIVE
Urobilinogen, UA: NEGATIVE

## 2013-01-20 LAB — POCT CBC
HCT, POC: 42.5 % (ref 37.7–47.9)
Hemoglobin: 13.7 g/dL (ref 12.2–16.2)
Lymph, poc: 2.3 (ref 0.6–3.4)
MCH, POC: 31.9 pg — AB (ref 27–31.2)
MCV: 98.5 fL — AB (ref 80–97)
POC Granulocyte: 4.8 (ref 2–6.9)
Platelet Count, POC: 167 10*3/uL (ref 142–424)
RBC: 4.3 M/uL (ref 4.04–5.48)
WBC: 7.4 10*3/uL (ref 4.6–10.2)

## 2013-01-20 LAB — POCT UA - MICROSCOPIC ONLY
Casts, Ur, LPF, POC: NEGATIVE
Crystals, Ur, HPF, POC: NEGATIVE

## 2013-01-20 MED ORDER — ARIPIPRAZOLE 5 MG PO TABS: 5.0000 mg | ORAL_TABLET | Freq: Once | ORAL | Status: DC

## 2013-01-20 NOTE — Patient Instructions (Signed)

## 2013-01-20 NOTE — Progress Notes (Signed)
   Subjective:    Patient ID: Roberta Brooks, female    DOB: January 18, 1966, 47 y.o.   MRN: [id number]  HPI Patoent here today for CPE and PAP- she is doing well right now other than she has fleas in her house and she is bitten all over.    Review of Systems  Constitutional: Negative.   HENT: Negative.   Respiratory: Negative.   Cardiovascular: Negative.   Gastrointestinal: Negative.   Genitourinary: Negative.   Musculoskeletal: Negative.   Neurological: Negative.   Hematological: Negative.   Psychiatric/Behavioral: Negative.   All other systems reviewed and are negative.       Objective:   Physical Exam  Constitutional: She is oriented to person, place, and time. She appears well-developed and well-nourished.  HENT:  Head: Normocephalic.  Right Ear: Hearing, tympanic membrane, external ear and ear canal normal.  Left Ear: Hearing, tympanic membrane, external ear and ear canal normal.  Nose: Nose normal.  Mouth/Throat: Uvula is midline and oropharynx is clear and moist.  Eyes: Conjunctivae and EOM are normal. Pupils are equal, round, and reactive to light.  Neck: Normal range of motion and full passive range of motion without pain. Neck supple. No JVD present. Carotid bruit is not present. No mass and no thyromegaly present.  Cardiovascular: Normal rate, normal heart sounds and intact distal pulses.   No murmur heard. Pulmonary/Chest: Effort normal and breath sounds normal. Right breast exhibits no inverted nipple, no mass, no nipple discharge, no skin change and no tenderness. Left breast exhibits no inverted nipple, no mass, no nipple discharge, no skin change and no tenderness.  Abdominal: Soft. Bowel sounds are normal. She exhibits no mass. There is no tenderness.  Genitourinary: Vagina normal and uterus normal. No breast swelling, tenderness, discharge or bleeding.  bimanual exam-No adnexal masses or tenderness. Cervix parous and pink- no discharge  Musculoskeletal: Normal  range of motion.  Lymphadenopathy:    She has no cervical adenopathy.  Neurological: She is alert and oriented to person, place, and time.  Skin: Skin is warm and dry.  Flea bites all over body Multiple bruises inner thighs  Psychiatric: She has a normal mood and affect. Her behavior is normal. Judgment and thought content normal.    BP 122/73  Pulse 96  Temp(Src) 96.5 F (35.8 C) (Oral)  Ht 5\' 4"  (1.626 m)  Wt 97 lb (43.999 kg)  BMI 16.64 kg/m2       Assessment & Plan:   1. Annual physical exam   2. Unspecified hypothyroidism   3. Depression   4. Encounter for routine gynecological examination   5. Bruising    Orders Placed This Encounter  Procedures  . CMP14+EGFR  . NMR, lipoprofile  . Thyroid Panel With TSH  . POCT UA - Microscopic Only  . POCT urinalysis dipstick  . POCT CBC   Meds ordered this encounter  Medications  . venlafaxine XR (EFFEXOR-XR) 75 MG 24 hr capsule    Sig:   . ARIPiprazole (ABILIFY) 5 MG tablet    Sig: Take 1 tablet (5 mg total) by mouth once.    Dispense:  30 tablet    Refill:  5    Order Specific Question:  Supervising Provider    Answer:  Ernestina Penna [[id number]]   Discussed how to get rid of fleas Continue all meds Labs pending Diet and exercise encouraged Health maintenance reviewed Follow up in 6 months  Mary-Margaret Daphine Deutscher, FNP

## 2013-01-23 LAB — CMP14+EGFR
AST: 30 IU/L (ref 0–40)
Albumin/Globulin Ratio: 2.2 (ref 1.1–2.5)
Alkaline Phosphatase: 80 IU/L (ref 39–117)
BUN/Creatinine Ratio: 13 (ref 9–23)
CO2: 27 mmol/L (ref 18–29)
Calcium: 9.9 mg/dL (ref 8.7–10.2)
Creatinine, Ser: 1.25 mg/dL — ABNORMAL HIGH (ref 0.57–1.00)
GFR calc Af Amer: 59 mL/min/{1.73_m2} — ABNORMAL LOW (ref >59)
GFR calc non Af Amer: 51 mL/min/{1.73_m2} — ABNORMAL LOW (ref >59)
Globulin, Total: 2.2 g/dL (ref 1.5–4.5)
Sodium: 140 mmol/L (ref 134–144)
Total Bilirubin: 0.4 mg/dL (ref 0.0–1.2)

## 2013-01-23 LAB — NMR, LIPOPROFILE
Cholesterol: 204 mg/dL — ABNORMAL HIGH (ref <200)
HDL Particle Number: 39.5 umol/L (ref >=30.5)
LDL Size: 21.5 nm (ref >20.5)
LDLC SERPL CALC-MCNC: 110 mg/dL — ABNORMAL HIGH (ref <100)
LP-IR Score: 36 (ref <=45)
Small LDL Particle Number: 370 nmol/L (ref <=527)

## 2013-01-23 LAB — THYROID PANEL WITH TSH
Free Thyroxine Index: 1.7 (ref 1.2–4.9)
T3 Uptake Ratio: 24 % (ref 24–39)
TSH: 39 u[IU]/mL — ABNORMAL HIGH (ref 0.450–4.500)

## 2013-01-25 ENCOUNTER — Telehealth: Payer: Self-pay | Admitting: Nurse Practitioner

## 2013-01-25 MED ORDER — ALPRAZOLAM 0.5 MG PO TABS: 0.5000 mg | ORAL_TABLET | Freq: Three times a day (TID) | ORAL | Status: DC | PRN

## 2013-01-25 NOTE — Telephone Encounter (Signed)
Please phone in xanax 0.5 1 po tid #90 no refill

## 2013-01-25 NOTE — Telephone Encounter (Signed)
[facility] aware and [facility]

## 2013-01-26 ENCOUNTER — Other Ambulatory Visit: Payer: Self-pay | Admitting: Nurse Practitioner

## 2013-01-26 LAB — PAP IG W/ RFLX HPV ASCU

## 2013-01-26 MED ORDER — LEVOTHYROXINE SODIUM 75 MCG PO TABS: 75.0000 ug | ORAL_TABLET | Freq: Every day | ORAL | Status: DC

## 2013-03-15 ENCOUNTER — Other Ambulatory Visit: Payer: Self-pay | Admitting: Nurse Practitioner

## 2013-04-04 ENCOUNTER — Other Ambulatory Visit: Payer: Self-pay | Admitting: Family Medicine

## 2013-04-06 ENCOUNTER — Other Ambulatory Visit: Payer: Self-pay | Admitting: Nurse Practitioner

## 2013-04-07 NOTE — Telephone Encounter (Signed)
Please call in xanax with 1 refills 

## 2013-04-07 NOTE — Telephone Encounter (Signed)
Patient last seen in office on 01-20-13. Rx last filled on 03-04-13. Please advise. If approved please route to Pool B so nurse can phone in to pharmacy

## 2013-04-08 NOTE — Telephone Encounter (Signed)
[facility]

## 2013-04-09 ENCOUNTER — Other Ambulatory Visit: Payer: Self-pay | Admitting: Nurse Practitioner

## 2013-04-12 NOTE — Telephone Encounter (Signed)
Last seen 01/20/13 MMM   If approved route to nurse to call in to [facility]

## 2013-04-12 NOTE — Telephone Encounter (Signed)
rx called into pharmacy

## 2013-04-12 NOTE — Telephone Encounter (Signed)
Please call in xanax with 1 refills 

## 2013-06-01 ENCOUNTER — Other Ambulatory Visit: Payer: Self-pay | Admitting: *Deleted

## 2013-06-01 MED ORDER — VENLAFAXINE HCL ER 75 MG PO CP24: ORAL_CAPSULE | ORAL | Status: DC

## 2013-06-18 ENCOUNTER — Telehealth: Payer: Self-pay | Admitting: Nurse Practitioner

## 2013-06-18 NOTE — Telephone Encounter (Signed)
Broke out in hives on abd and back this morning. Denies any difficulty breathing or swallowing or tongue swelling. Denies any change in soaps, lotions, detergents, foods, etc. Explained that it's not always easy to pinpoint an allergen. Advised to take 2 Benadryl today. Will cause drowsiness.  Can take Zyrtec or Claritin in the morning. She should schedule an appointment if it doesn't resolve and call 911 if she develops any difficulty breathing or swallowing.  Patient stated understanding and agreement to plan.

## 2013-07-05 ENCOUNTER — Telehealth: Payer: Self-pay | Admitting: Nurse Practitioner

## 2013-07-05 NOTE — Telephone Encounter (Signed)
appt given for wed. Patient could not come in before then

## 2013-07-06 ENCOUNTER — Other Ambulatory Visit: Payer: Self-pay

## 2013-07-06 NOTE — Telephone Encounter (Signed)
Last seen 01/20/13  MMM Requesting 90 day supply

## 2013-07-06 NOTE — Telephone Encounter (Signed)
Last seen 11/14  MMM  Requesting 90 day supply

## 2013-07-07 ENCOUNTER — Ambulatory Visit (INDEPENDENT_AMBULATORY_CARE_PROVIDER_SITE_OTHER): Payer: Medicare Other | Admitting: Family Medicine

## 2013-07-07 ENCOUNTER — Encounter: Payer: Self-pay | Admitting: Family Medicine

## 2013-07-07 VITALS — BP 134/79 | HR 91 | Temp 98.7°F | Ht 64.0 in | Wt 100.0 lb

## 2013-07-07 DIAGNOSIS — J029 Acute pharyngitis, unspecified: Secondary | ICD-10-CM

## 2013-07-07 LAB — POCT RAPID STREP A (OFFICE): Rapid Strep A Screen: NEGATIVE

## 2013-07-07 MED ORDER — AZITHROMYCIN 250 MG PO TABS: ORAL_TABLET | ORAL | Status: DC

## 2013-07-07 NOTE — Progress Notes (Signed)
   Subjective:    Patient ID: Roberta Brooks, female    DOB: 18-Nov-1965, 48 y.o.   MRN: [id number]  HPI  This 48 y.o. female presents for evaluation of chronic constipation problems.  She has  Been having a sore throat and uri sx's.  Review of Systems C/o sore throat No chest pain, SOB, HA, dizziness, vision change, N/V, diarrhea, constipation, dysuria, urinary urgency or frequency, myalgias, arthralgias or rash.     Objective:   Physical Exam Vital signs noted  Well developed well nourished female.  HEENT - Head atraumatic Normocephalic                Eyes - PERRLA, Conjuctiva - clear Sclera- Clear EOMI                Ears - EAC's Wnl TM's Wnl Gross Hearing WNL                 Throat - oropharanx injected Respiratory - Lungs CTA bilateral Cardiac - RRR S1 and S2 without murmur GI - Abdomen soft Nontender and bowel sounds active x 4   Results for orders placed in visit on 07/07/13  POCT RAPID STREP A (OFFICE)      Result Value Ref Range   Rapid Strep A Screen Negative  Negative       Assessment & Plan:  Sore throat - Plan: POCT rapid strep A, azithromycin (ZITHROMAX) 250 MG tablet  Acute pharyngitis - Plan: azithromycin (ZITHROMAX) 250 MG tablet  Push po fluids, rest, tylenol and motrin otc prn as directed for fever, arthralgias, and myalgias.  Follow up prn if sx's continue or persist.  Lysbeth Penner FNP

## 2013-07-08 MED ORDER — VENLAFAXINE HCL ER 75 MG PO CP24
75.0000 mg | ORAL_CAPSULE | Freq: Every day | ORAL | Status: DC
Start: ? — End: 2013-10-22

## 2013-07-08 MED ORDER — ARIPIPRAZOLE 5 MG PO TABS: 5.0000 mg | ORAL_TABLET | Freq: Once | ORAL | Status: DC

## 2013-07-08 NOTE — Telephone Encounter (Signed)
Patient NTBS for follow up and lab work  

## 2013-07-21 ENCOUNTER — Ambulatory Visit (INDEPENDENT_AMBULATORY_CARE_PROVIDER_SITE_OTHER): Payer: Medicare Other | Admitting: Family Medicine

## 2013-07-21 ENCOUNTER — Encounter: Payer: Self-pay | Admitting: Family Medicine

## 2013-07-21 VITALS — BP 122/69 | HR 98 | Temp 98.9°F | Wt 100.8 lb

## 2013-07-21 DIAGNOSIS — R21 Rash and other nonspecific skin eruption: Secondary | ICD-10-CM

## 2013-07-21 DIAGNOSIS — J309 Allergic rhinitis, unspecified: Secondary | ICD-10-CM | POA: Diagnosis not present

## 2013-07-21 DIAGNOSIS — J302 Other seasonal allergic rhinitis: Secondary | ICD-10-CM

## 2013-07-21 MED ORDER — METHYLPREDNISOLONE ACETATE 80 MG/ML IJ SUSP
80.0000 mg | Freq: Once | INTRAMUSCULAR | Status: AC
Start: 2013-07-21 — End: 2013-07-21
  Administered 2013-07-21: 80 mg via INTRAMUSCULAR

## 2013-07-21 MED ORDER — METHYLPREDNISOLONE (PAK) 4 MG PO TABS: ORAL_TABLET | ORAL | Status: DC

## 2013-07-21 MED ORDER — MONTELUKAST SODIUM 10 MG PO TABS: 10.0000 mg | ORAL_TABLET | Freq: Every day | ORAL | Status: DC

## 2013-07-21 MED ORDER — HYDROXYZINE HCL 25 MG PO TABS: ORAL_TABLET | ORAL | Status: DC

## 2013-07-21 NOTE — Progress Notes (Signed)
   Subjective:    Patient ID: Roberta Brooks, female    DOB: [birth year], 48 y.o.   MRN: [id number]  HPI  This 48 y.o. female presents for evaluation of rash on her back, chest, and back.  Review of Systems    No chest pain, SOB, HA, dizziness, vision change, N/V, diarrhea, constipation, dysuria, urinary urgency or frequency, myalgias, arthralgias or rash.  Objective:   Physical Exam  Vital signs noted  Well developed well nourished female.  HEENT - Head atraumatic Normocephalic                Eyes - PERRLA, Conjuctiva - clear Sclera- Clear EOMI                Ears - EAC's Wnl TM's Wnl Gross Hearing WNL                Nose - Nares patent                 Throat - oropharanx wnl Respiratory - Lungs CTA bilateral Cardiac - RRR S1 and S2 without murmur GI - Abdomen soft Nontender and bowel sounds active x 4 Extremities - No edema. Neuro - Grossly intact.      Assessment & Plan:  Rash and nonspecific skin eruption - Plan: methylPREDNIsolone (MEDROL DOSPACK) 4 MG tablet, methylPREDNISolone acetate (DEPO-MEDROL) injection 80 mg, hydrOXYzine (ATARAX/VISTARIL) 25 MG tablet  Seasonal allergies - Plan: montelukast (SINGULAIR) 10 MG tablet  Lysbeth Penner FNP

## 2013-08-13 ENCOUNTER — Other Ambulatory Visit: Payer: Self-pay | Admitting: Nurse Practitioner

## 2013-08-16 NOTE — Telephone Encounter (Signed)
Please call in xanax with 1 refills 

## 2013-08-16 NOTE — Telephone Encounter (Signed)
Last filled 07/07/13, [facility]

## 2013-08-17 NOTE — Telephone Encounter (Signed)
Phoned in.

## 2013-09-30 ENCOUNTER — Encounter: Payer: Self-pay | Admitting: Nurse Practitioner

## 2013-09-30 ENCOUNTER — Telehealth: Payer: Self-pay | Admitting: *Deleted

## 2013-09-30 NOTE — Telephone Encounter (Signed)
Letter ready for pick up

## 2013-09-30 NOTE — Telephone Encounter (Signed)
Would like a letter excusing her from jury duty. She is disabled and on medications and doesn't feel like she will be able to handle serving on the jury. Please advise

## 2013-10-01 NOTE — Telephone Encounter (Signed)
Patient aware to pick up letter

## 2013-10-22 ENCOUNTER — Other Ambulatory Visit: Payer: Self-pay | Admitting: Nurse Practitioner

## 2013-10-22 ENCOUNTER — Encounter: Payer: Self-pay | Admitting: Family Medicine

## 2013-10-22 ENCOUNTER — Ambulatory Visit (INDEPENDENT_AMBULATORY_CARE_PROVIDER_SITE_OTHER): Payer: Medicare Other | Admitting: Family Medicine

## 2013-10-22 VITALS — BP 150/78 | HR 84 | Temp 99.7°F | Ht 64.0 in | Wt 101.6 lb

## 2013-10-22 DIAGNOSIS — E039 Hypothyroidism, unspecified: Secondary | ICD-10-CM

## 2013-10-22 DIAGNOSIS — F329 Major depressive disorder, single episode, unspecified: Secondary | ICD-10-CM | POA: Diagnosis not present

## 2013-10-22 DIAGNOSIS — F3289 Other specified depressive episodes: Secondary | ICD-10-CM

## 2013-10-22 DIAGNOSIS — J309 Allergic rhinitis, unspecified: Secondary | ICD-10-CM

## 2013-10-22 DIAGNOSIS — J302 Other seasonal allergic rhinitis: Secondary | ICD-10-CM

## 2013-10-22 DIAGNOSIS — F32A Depression, unspecified: Secondary | ICD-10-CM

## 2013-10-22 MED ORDER — VENLAFAXINE HCL ER 75 MG PO CP24: ORAL_CAPSULE | ORAL | Status: DC

## 2013-10-22 MED ORDER — ARIPIPRAZOLE 5 MG PO TABS
5.0000 mg | ORAL_TABLET | Freq: Once | ORAL | Status: DC
Start: 2013-10-22 — End: 2013-11-08

## 2013-10-22 MED ORDER — ALPRAZOLAM 0.5 MG PO TABS: ORAL_TABLET | ORAL | Status: DC

## 2013-10-22 MED ORDER — MONTELUKAST SODIUM 10 MG PO TABS: 10.0000 mg | ORAL_TABLET | Freq: Every day | ORAL | Status: DC

## 2013-10-22 NOTE — Assessment & Plan Note (Addendum)
Worsening of symptoms due to difficult family dynamics and lack of support Recommending pt to contact health dept to get a social worker assigned to daughter and children to help them w/ resources - primarily housing Pt to continue current regimen Counseled to take time for self and find some sleeping arrangedment, utilizing other rooms to be able to get more sleep at night.  Pt denies SI/HI.  Continues to use Benzo and tobacco. Denies illegal drug use.  F/u in 2-4 wks TSH previously abnormal w/o f/u. Reports taking meds now. TSH today as this may be contributing to overall feelings of depression

## 2013-10-22 NOTE — Assessment & Plan Note (Addendum)
Previously extremely hypothyroid Taking meds TSH today  TSH low Decrease levo to 59mcg
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
## 2013-10-22 NOTE — Patient Instructions (Signed)
You are having a really tough time right now primarily because of your circumstances Please have your daughter or yourself call the [facility] and ask to be assigned a Education officer, museum who can help them find resources so they can get their own living space In the meantime make sure to take time for yourself, even if that's as simple as a daily walk or time alone outside in the warm sunny weather.  Seek help through community or family support Please come back in 2-4 weeks

## 2013-10-23 LAB — TSH: TSH: 0.106 u[IU]/mL — ABNORMAL LOW (ref 0.450–4.500)

## 2013-10-25 ENCOUNTER — Other Ambulatory Visit: Payer: Self-pay | Admitting: Nurse Practitioner

## 2013-10-27 MED ORDER — LEVOTHYROXINE SODIUM 50 MCG PO TABS: 50.0000 ug | ORAL_TABLET | Freq: Every day | ORAL | Status: DC

## 2013-10-27 NOTE — Addendum Note (Signed)
Addended by: Waldemar Dickens on: 10/27/2013 05:41 PM   Modules accepted: Orders

## 2013-11-08 ENCOUNTER — Ambulatory Visit (INDEPENDENT_AMBULATORY_CARE_PROVIDER_SITE_OTHER): Payer: Medicare Other | Admitting: Nurse Practitioner

## 2013-11-08 ENCOUNTER — Encounter: Payer: Self-pay | Admitting: Nurse Practitioner

## 2013-11-08 VITALS — BP 135/70 | HR 85 | Temp 97.4°F | Ht 64.0 in | Wt 102.4 lb

## 2013-11-08 DIAGNOSIS — F3289 Other specified depressive episodes: Secondary | ICD-10-CM

## 2013-11-08 DIAGNOSIS — E039 Hypothyroidism, unspecified: Secondary | ICD-10-CM | POA: Diagnosis not present

## 2013-11-08 DIAGNOSIS — M65352 Trigger finger, left little finger: Secondary | ICD-10-CM

## 2013-11-08 DIAGNOSIS — F32A Depression, unspecified: Secondary | ICD-10-CM

## 2013-11-08 DIAGNOSIS — M6530 Trigger finger, unspecified finger: Secondary | ICD-10-CM | POA: Diagnosis not present

## 2013-11-08 DIAGNOSIS — F329 Major depressive disorder, single episode, unspecified: Secondary | ICD-10-CM | POA: Diagnosis not present

## 2013-11-08 MED ORDER — ARIPIPRAZOLE 10 MG PO TABS: 10.0000 mg | ORAL_TABLET | Freq: Every day | ORAL | Status: DC

## 2013-11-08 NOTE — Progress Notes (Signed)
   Subjective:    Patient ID: Roberta Brooks, female    DOB: 1966-02-24, 48 y.o.   MRN: [id number]  HPI Patient in today to discuss depression- She is currently on abilify and effexor. She says that she is under a lot os stress with her daughter and her grandchildren living with her. SHe is not eating god and she has had weight loss.  * had surgery on left arm and says that she has been having cramps in left 5th finger and cannot straighten it out.  Review of Systems  Constitutional: Positive for appetite change (decreased).  HENT: Negative.   Respiratory: Negative.   Cardiovascular: Negative.   Gastrointestinal: Negative.   Genitourinary: Negative.   Neurological: Negative.   Psychiatric/Behavioral: Positive for sleep disturbance. The patient is nervous/anxious.   All other systems reviewed and are negative.      Objective:   Physical Exam  Constitutional: She is oriented to person, place, and time. She appears well-developed and well-nourished.  Cardiovascular: Normal rate, regular rhythm and normal heart sounds.   Pulmonary/Chest: Effort normal and breath sounds normal.  Abdominal: Soft. Bowel sounds are normal.  Musculoskeletal:  Left 5th trigger finger- unable to straighten out.  Neurological: She is alert and oriented to person, place, and time.  Skin: Skin is warm and dry.  Psychiatric: She has a normal mood and affect. Her behavior is normal. Judgment and thought content normal.  BP 135/70  Pulse 85  Temp(Src) 97.4 F (36.3 C) (Oral)  Ht 5\' 4"  (1.626 m)  Wt 102 lb 6.4 oz (46.448 kg)  BMI 17.57 kg/m2         Assessment & Plan:   1. Unspecified hypothyroidism   2. Depression   3. Trigger little finger of left hand    Referral to [facility]- trigger finger Increase abilify to 10 mg daily Stress management Follow up in 1 month Need to eat 3 meals a day due to weight loss  Mary-Margaret Hassell Done, FNP

## 2013-11-08 NOTE — Patient Instructions (Signed)
Stress and Stress Management Stress is a normal reaction to life events. It is what you feel when life demands more than you are used to or more than you can handle. Some stress can be useful. For example, the stress reaction can help you catch the last bus of the day, study for a test, or meet a deadline at work. But stress that occurs too often or for too long can cause problems. It can affect your emotional health and interfere with relationships and normal daily activities. Too much stress can weaken your immune system and increase your risk for physical illness. If you already have a medical problem, stress can make it worse. CAUSES  All sorts of life events may cause stress. An event that causes stress for one person may not be stressful for another person. Major life events commonly cause stress. These may be positive or negative. Examples include losing your job, moving into a new home, getting married, having a baby, or losing a loved one. Less obvious life events may also cause stress, especially if they occur day after day or in combination. Examples include working long hours, driving in traffic, caring for children, being in debt, or being in a difficult relationship. SIGNS AND SYMPTOMS Stress may cause emotional symptoms including, the following:  Anxiety. This is feeling worried, afraid, on edge, overwhelmed, or out of control.  Anger. This is feeling irritated or impatient.  Depression. This is feeling sad, down, helpless, or guilty.  Difficulty focusing, remembering, or making decisions. Stress may cause physical symptoms, including the following:   Aches and pains. These may affect your head, neck, back, stomach, or other areas of your body.  Tight muscles or clenched jaw.  Low energy or trouble sleeping. Stress may cause unhealthy behaviors, including the following:   Eating to feel better (overeating) or skipping meals.  Sleeping too little, too much, or both.  Working  too much or putting off tasks (procrastination).  Smoking, drinking alcohol, or using drugs to feel better. DIAGNOSIS  Stress is diagnosed through an assessment by your health care provider. Your health care provider will ask questions about your symptoms and any stressful life events.Your health care provider will also ask about your medical history and may order blood tests or other tests. Certain medical conditions and medicine can cause physical symptoms similar to stress. Mental illness can cause emotional symptoms and unhealthy behaviors similar to stress. Your health care provider may refer you to a mental health professional for further evaluation.  TREATMENT  Stress management is the recommended treatment for stress.The goals of stress management are reducing stressful life events and coping with stress in healthy ways.  Techniques for reducing stressful life events include the following:  Stress identification. Self-monitor for stress and identify what causes stress for you. These skills may help you to avoid some stressful events.  Time management. Set your priorities, keep a calendar of events, and learn to say "no." These tools can help you avoid making too many commitments. Techniques for coping with stress include the following:  Rethinking the problem. Try to think realistically about stressful events rather than ignoring them or overreacting. Try to find the positives in a stressful situation rather than focusing on the negatives.  Exercise. Physical exercise can release both physical and emotional tension. The key is to find a form of exercise you enjoy and do it regularly.  Relaxation techniques. These relax the body and mind. Examples include yoga, meditation, tai chi, biofeedback, deep  breathing, progressive muscle relaxation, listening to music, being out in nature, journaling, and other hobbies. Again, the key is to find one or more that you enjoy and can do  regularly.  Healthy lifestyle. Eat a balanced diet, get plenty of sleep, and do not smoke. Avoid using alcohol or drugs to relax.  Strong support network. Spend time with family, friends, or other people you enjoy being around.Express your feelings and talk things over with someone you trust. Counseling or talktherapy with a mental health professional may be helpful if you are having difficulty managing stress on your own. Medicine is typically not recommended for the treatment of stress.Talk to your health care provider if you think you need medicine for symptoms of stress. HOME CARE INSTRUCTIONS  Keep all follow-up visits as directed by your health care provider.  Take all medicines as directed by your health care provider. SEEK MEDICAL CARE IF:  Your symptoms get worse or you start having new symptoms.  You feel overwhelmed by your problems and can no longer manage them on your own. SEEK IMMEDIATE MEDICAL CARE IF:  You feel like hurting yourself or someone else. Document Released: 08/07/2000 Document Revised: 06/28/2013 Document Reviewed: 10/06/2012 ExitCare Patient Information 2015 [employer]. This information is not intended to replace advice given to you by your health care provider. Make sure you discuss any questions you have with your health care provider.  

## 2013-12-02 ENCOUNTER — Other Ambulatory Visit: Payer: Self-pay | Admitting: Nurse Practitioner

## 2013-12-04 NOTE — Telephone Encounter (Signed)
Please call in xanax with 1 refills 

## 2013-12-04 NOTE — Telephone Encounter (Signed)
Patient last seen in office on 11-08-13. Rx last filled on 09-24-13 for #90. Please advise. If approved please route to Pool B so nurse can phone in to pharmacy

## 2013-12-06 NOTE — Telephone Encounter (Signed)
Called into pharmacy voice mail

## 2013-12-14 DIAGNOSIS — G5622 Lesion of ulnar nerve, left upper limb: Secondary | ICD-10-CM | POA: Diagnosis not present

## 2013-12-24 DIAGNOSIS — M25532 Pain in left wrist: Secondary | ICD-10-CM | POA: Diagnosis not present

## 2014-01-04 DIAGNOSIS — G5622 Lesion of ulnar nerve, left upper limb: Secondary | ICD-10-CM | POA: Diagnosis not present

## 2014-01-31 DIAGNOSIS — G5622 Lesion of ulnar nerve, left upper limb: Secondary | ICD-10-CM | POA: Diagnosis not present

## 2014-02-15 ENCOUNTER — Other Ambulatory Visit: Payer: Self-pay | Admitting: *Deleted

## 2014-02-15 MED ORDER — VENLAFAXINE HCL ER 75 MG PO CP24: ORAL_CAPSULE | ORAL | Status: DC

## 2014-02-23 DIAGNOSIS — M24142 Other articular cartilage disorders, left hand: Secondary | ICD-10-CM | POA: Diagnosis not present

## 2014-02-23 DIAGNOSIS — G5622 Lesion of ulnar nerve, left upper limb: Secondary | ICD-10-CM | POA: Diagnosis not present

## 2014-03-10 DIAGNOSIS — G5622 Lesion of ulnar nerve, left upper limb: Secondary | ICD-10-CM | POA: Diagnosis not present

## 2014-03-10 DIAGNOSIS — Z4789 Encounter for other orthopedic aftercare: Secondary | ICD-10-CM | POA: Diagnosis not present

## 2014-04-12 ENCOUNTER — Telehealth: Payer: Self-pay | Admitting: Nurse Practitioner

## 2014-04-12 NOTE — Telephone Encounter (Signed)
I can't find a phone number to call the pt back on.

## 2014-04-13 NOTE — Telephone Encounter (Signed)
Patient takes abilify and xanax. Wants to know if adderall will help her or hurt her. Was thinking of making an appt and coming in to discuss it. Thinks that it may help with her appetite and energy. Please advise

## 2014-04-14 NOTE — Telephone Encounter (Signed)
We do not use adderall fr that purpose- we only use for ADHDp

## 2014-04-14 NOTE — Telephone Encounter (Signed)
Stp advised of provider feedback. 

## 2014-04-18 ENCOUNTER — Telehealth: Payer: Self-pay | Admitting: Nurse Practitioner

## 2014-04-18 NOTE — Telephone Encounter (Signed)
Pt given appt with MMM 3/2 at 9:45 to discuss anxiety issues and medications.

## 2014-04-18 NOTE — Telephone Encounter (Signed)
Returning [facility] call.  Call [facility] at [phone]

## 2014-04-21 DIAGNOSIS — E039 Hypothyroidism, unspecified: Secondary | ICD-10-CM | POA: Diagnosis not present

## 2014-04-21 DIAGNOSIS — Z79899 Other long term (current) drug therapy: Secondary | ICD-10-CM | POA: Diagnosis not present

## 2014-04-21 DIAGNOSIS — E0500 Thyrotoxicosis with diffuse goiter without thyrotoxic crisis or storm: Secondary | ICD-10-CM | POA: Diagnosis not present

## 2014-04-21 DIAGNOSIS — F17200 Nicotine dependence, unspecified, uncomplicated: Secondary | ICD-10-CM | POA: Diagnosis not present

## 2014-04-21 DIAGNOSIS — F329 Major depressive disorder, single episode, unspecified: Secondary | ICD-10-CM | POA: Diagnosis not present

## 2014-04-27 ENCOUNTER — Ambulatory Visit (INDEPENDENT_AMBULATORY_CARE_PROVIDER_SITE_OTHER): Payer: Medicare Other | Admitting: Nurse Practitioner

## 2014-04-27 ENCOUNTER — Encounter: Payer: Self-pay | Admitting: Nurse Practitioner

## 2014-04-27 VITALS — BP 107/65 | HR 80 | Temp 97.9°F | Ht 64.0 in | Wt 109.0 lb

## 2014-04-27 DIAGNOSIS — F32A Depression, unspecified: Secondary | ICD-10-CM

## 2014-04-27 DIAGNOSIS — G4700 Insomnia, unspecified: Secondary | ICD-10-CM | POA: Diagnosis not present

## 2014-04-27 DIAGNOSIS — F329 Major depressive disorder, single episode, unspecified: Secondary | ICD-10-CM | POA: Diagnosis not present

## 2014-04-27 MED ORDER — ZOLPIDEM TARTRATE 5 MG PO TABS: 5.0000 mg | ORAL_TABLET | Freq: Every evening | ORAL | Status: DC | PRN

## 2014-04-27 MED ORDER — ESCITALOPRAM OXALATE 20 MG PO TABS: 20.0000 mg | ORAL_TABLET | Freq: Every day | ORAL | Status: DC

## 2014-04-27 NOTE — Patient Instructions (Signed)
Insomnia Insomnia is frequent trouble falling and/or staying asleep. Insomnia can be a long term problem or a short term problem. Both are common. Insomnia can be a short term problem when the wakefulness is related to a certain stress or worry. Long term insomnia is often related to ongoing stress during waking hours and/or poor sleeping habits. Overtime, sleep deprivation itself can make the problem worse. Every little thing feels more severe because you are overtired and your ability to cope is decreased. CAUSES   Stress, anxiety, and depression.  Poor sleeping habits.  Distractions such as TV in the bedroom.  Naps close to bedtime.  Engaging in emotionally charged conversations before bed.  Technical reading before sleep.  Alcohol and other sedatives. They may make the problem worse. They can hurt normal sleep patterns and normal dream activity.  Stimulants such as caffeine for several hours prior to bedtime.  Pain syndromes and shortness of breath can cause insomnia.  Exercise late at night.  Changing time zones may cause sleeping problems (jet lag). It is sometimes helpful to have someone observe your sleeping patterns. They should look for periods of not breathing during the night (sleep apnea). They should also look to see how long those periods last. If you live alone or observers are uncertain, you can also be observed at a sleep clinic where your sleep patterns will be professionally monitored. Sleep apnea requires a checkup and treatment. Give your caregivers your medical history. Give your caregivers observations your family has made about your sleep.  SYMPTOMS   Not feeling rested in the morning.  Anxiety and restlessness at bedtime.  Difficulty falling and staying asleep. TREATMENT   Your caregiver may prescribe treatment for an underlying medical disorders. Your caregiver can give advice or help if you are using alcohol or other drugs for self-medication. Treatment  of underlying problems will usually eliminate insomnia problems.  Medications can be prescribed for short time use. They are generally not recommended for lengthy use.  Over-the-counter sleep medicines are not recommended for lengthy use. They can be habit forming.  You can promote easier sleeping by making lifestyle changes such as:  Using relaxation techniques that help with breathing and reduce muscle tension.  Exercising earlier in the day.  Changing your diet and the time of your last meal. No night time snacks.  Establish a regular time to go to bed.  Counseling can help with stressful problems and worry.  Soothing music and white noise may be helpful if there are background noises you cannot remove.  Stop tedious detailed work at least one hour before bedtime. HOME CARE INSTRUCTIONS   Keep a diary. Inform your caregiver about your progress. This includes any medication side effects. See your caregiver regularly. Take note of:  Times when you are asleep.  Times when you are awake during the night.  The quality of your sleep.  How you feel the next day. This information will help your caregiver care for you.  Get out of bed if you are still awake after 15 minutes. Read or do some quiet activity. Keep the lights down. Wait until you feel sleepy and go back to bed.  Keep regular sleeping and waking hours. Avoid naps.  Exercise regularly.  Avoid distractions at bedtime. Distractions include watching television or engaging in any intense or detailed activity like attempting to balance the household checkbook.  Develop a bedtime ritual. Keep a familiar routine of bathing, brushing your teeth, climbing into bed at the same   time each night, listening to soothing music. Routines increase the success of falling to sleep faster.  Use relaxation techniques. This can be using breathing and muscle tension release routines. It can also include visualizing peaceful scenes. You can  also help control troubling or intruding thoughts by keeping your mind occupied with boring or repetitive thoughts like the old concept of counting sheep. You can make it more creative like imagining planting one beautiful flower after another in your backyard garden.  During your day, work to eliminate stress. When this is not possible use some of the previous suggestions to help reduce the anxiety that accompanies stressful situations. MAKE SURE YOU:   Understand these instructions.  Will watch your condition.  Will get help right away if you are not doing well or get worse. Document Released: 02/09/2000 Document Revised: 05/06/2011 Document Reviewed: 03/11/2007 ExitCare Patient Information 2015 [employer]. This information is not intended to replace advice given to you by your health care provider. Make sure you discuss any questions you have with your health care provider.  

## 2014-04-27 NOTE — Progress Notes (Signed)
   Subjective:    Patient ID: Roberta Brooks, female    DOB: 07-Dec-1965, 49 y.o.   MRN: [id number]  HPI Patient in today with several complaints: 1- depression- currently on effexor and abilify- patient says neither one is helping- she stopped taking them over a week ago. She say she had been on both of these meds for a long time. Having trouble sleeping- was taking xanax to sleep and that only gives her 1 hour of sleep. Has no interest in doing anything anymore- wants to stay at home and be by herself. No energy to do anything. Having trouble maintaining her weight. 2- hypothyroidism- levothyroxin 62mcg and TSH at hospital was low and they increased her to levothyroin 31mcg but patient has not yet picked up rx.    Review of Systems  Constitutional: Negative.   HENT: Negative.   Respiratory: Negative.   Cardiovascular: Negative.   Genitourinary: Negative.   Neurological: Negative.   Psychiatric/Behavioral: Positive for sleep disturbance and dysphoric mood. Negative for suicidal ideas. The patient is nervous/anxious.   All other systems reviewed and are negative.      Objective:   Physical Exam  Constitutional: She is oriented to person, place, and time. She appears well-developed and well-nourished.  Cardiovascular: Normal rate, regular rhythm and normal heart sounds.   Pulmonary/Chest: Effort normal and breath sounds normal.  Neurological: She is alert and oriented to person, place, and time.  Skin: Skin is warm and dry.  Psychiatric: Her speech is normal and behavior is normal. Judgment normal. Thought content is not delusional. Cognition and memory are normal. She exhibits a depressed mood. She expresses no homicidal and no suicidal ideation.    BP 107/65 mmHg  Pulse 80  Temp(Src) 97.9 F (36.6 C) (Oral)  Ht 5\' 4"  (1.626 m)  Wt 109 lb (49.442 kg)  BMI 18.70 kg/m2       Assessment & Plan:  1. Insomnia Bedtime ritual DO NOT TAKE XANAX with AMBIEN - zolpidem (AMBIEN) 5 MG  tablet; Take 1 tablet (5 mg total) by mouth at bedtime as needed for sleep.  Dispense: 30 tablet; Refill: 1  2. Depression Stress management Get out of house and exercise Eat 3 meals a day - escitalopram (LEXAPRO) 20 MG tablet; Take 1 tablet (20 mg total) by mouth daily.  Dispense: 30 tablet; Refill: 5 Follow up in 3 weeks  Mary-Margaret Hassell Done, FNP

## 2014-05-12 ENCOUNTER — Other Ambulatory Visit: Payer: Self-pay | Admitting: Nurse Practitioner

## 2014-05-12 NOTE — Telephone Encounter (Signed)
Last seen 04/27/14 MMM This med not on EPIC list

## 2014-05-18 ENCOUNTER — Ambulatory Visit: Payer: Medicare Other | Admitting: Nurse Practitioner

## 2014-05-24 ENCOUNTER — Other Ambulatory Visit: Payer: Self-pay | Admitting: Nurse Practitioner

## 2014-05-25 NOTE — Telephone Encounter (Signed)
Last seen 04/27/14 MMM  If approved route to nurse to call into [facility]

## 2014-05-25 NOTE — Telephone Encounter (Signed)
Please call in xanax with 1 refills 

## 2014-06-03 DIAGNOSIS — E0500 Thyrotoxicosis with diffuse goiter without thyrotoxic crisis or storm: Secondary | ICD-10-CM | POA: Diagnosis not present

## 2014-06-03 DIAGNOSIS — L299 Pruritus, unspecified: Secondary | ICD-10-CM | POA: Diagnosis not present

## 2014-06-03 DIAGNOSIS — R531 Weakness: Secondary | ICD-10-CM | POA: Diagnosis not present

## 2014-08-02 ENCOUNTER — Other Ambulatory Visit: Payer: Self-pay | Admitting: Nurse Practitioner

## 2014-08-02 NOTE — Telephone Encounter (Signed)
Last seen 04/27/14 MMM  This med not on EPIC list

## 2014-09-16 ENCOUNTER — Encounter: Payer: Self-pay | Admitting: Gastroenterology

## 2014-09-19 ENCOUNTER — Encounter: Payer: Self-pay | Admitting: Gastroenterology

## 2014-09-29 ENCOUNTER — Other Ambulatory Visit: Payer: Self-pay | Admitting: Nurse Practitioner

## 2014-09-29 NOTE — Telephone Encounter (Signed)
rx called into pharmacy

## 2014-09-29 NOTE — Telephone Encounter (Signed)
Please call in xanax with 1 refills 

## 2014-09-29 NOTE — Telephone Encounter (Signed)
Last filled 07/30/14, last seen 04/27/14. Call in at [facility]

## 2014-11-14 ENCOUNTER — Ambulatory Visit (INDEPENDENT_AMBULATORY_CARE_PROVIDER_SITE_OTHER): Payer: Medicare Other | Admitting: Physician Assistant

## 2014-11-14 ENCOUNTER — Encounter: Payer: Self-pay | Admitting: Physician Assistant

## 2014-11-14 VITALS — BP 122/73 | HR 85 | Temp 97.9°F | Ht 64.0 in | Wt 81.0 lb

## 2014-11-14 DIAGNOSIS — J40 Bronchitis, not specified as acute or chronic: Secondary | ICD-10-CM | POA: Diagnosis not present

## 2014-11-14 MED ORDER — CETIRIZINE HCL 10 MG PO TABS: 10.0000 mg | ORAL_TABLET | Freq: Every day | ORAL | Status: DC

## 2014-11-14 MED ORDER — ALBUTEROL SULFATE HFA 108 (90 BASE) MCG/ACT IN AERS: 2.0000 | INHALATION_SPRAY | Freq: Four times a day (QID) | RESPIRATORY_TRACT | Status: DC | PRN

## 2014-11-14 MED ORDER — AZITHROMYCIN 250 MG PO TABS: ORAL_TABLET | ORAL | Status: DC

## 2014-11-14 NOTE — Patient Instructions (Signed)

## 2014-11-14 NOTE — Progress Notes (Signed)
   Subjective:    Patient ID: Roberta Brooks, female    DOB: Jul 17, 1965, 49 y.o.   MRN: [id number]  HPI  49 y/o female presents with c/o cough x 1 week. She has been taking Advil congestion relief but does not have congestion. She wakes up during the night sweating. She has also tried cough drops with no relief. Dry cough, non productive. Smokes 1/2 - 1 ppd cigarettes.    Review of Systems  Constitutional: Positive for diaphoresis.  HENT: Negative for congestion, rhinorrhea, sneezing and sore throat.   Respiratory: Positive for cough (dry nonproductive ), chest tightness and wheezing. Negative for shortness of breath.   Cardiovascular: Negative.   Gastrointestinal: Negative.   Endocrine: Negative.   Genitourinary: Negative.   Musculoskeletal: Negative.   Skin: Negative.   Psychiatric/Behavioral: Negative.        Objective:   Physical Exam  Constitutional: She is oriented to person, place, and time. No distress.  Malnourished, frail   HENT:  Head: Normocephalic and atraumatic.  Posterior pharynx erythema bilaterally   Neck: No thyromegaly present.  Cardiovascular: Normal rate, regular rhythm, normal heart sounds and intact distal pulses.   Pulmonary/Chest: Effort normal. No respiratory distress. She has wheezes (mild basilar wheezing bilaterally ). She has no rales. She exhibits no tenderness.  Musculoskeletal: She exhibits no edema.  Neurological: She is alert and oriented to person, place, and time.  Skin: She is not diaphoretic.  Psychiatric: She has a normal mood and affect. Her behavior is normal. Judgment and thought content normal.  Nursing note and vitals reviewed.         Assessment & Plan:  1. Bronchitis  - azithromycin (ZITHROMAX) 250 MG tablet; 2 tabs on day 1, 1 tab on day 2-5  Dispense: 6 tablet; Refill: 0 - cetirizine (ZYRTEC) 10 MG tablet; Take 1 tablet (10 mg total) by mouth daily.  Dispense: 30 tablet; Refill: 11 - albuterol (PROVENTIL HFA;VENTOLIN HFA)  108 (90 BASE) MCG/ACT inhaler; Inhale 2 puffs into the lungs every 6 (six) hours as needed for wheezing or shortness of breath.  Dispense: 1 Inhaler; Refill: 0   RTC after taking medications if no improvement  Tiffany A.  Stain PA-C

## 2014-11-15 ENCOUNTER — Ambulatory Visit (INDEPENDENT_AMBULATORY_CARE_PROVIDER_SITE_OTHER): Payer: Medicare Other | Admitting: Nurse Practitioner

## 2014-11-15 ENCOUNTER — Encounter: Payer: Self-pay | Admitting: Nurse Practitioner

## 2014-11-15 VITALS — BP 132/67 | HR 71 | Temp 97.1°F | Ht 64.0 in | Wt 81.6 lb

## 2014-11-15 DIAGNOSIS — F329 Major depressive disorder, single episode, unspecified: Secondary | ICD-10-CM

## 2014-11-15 DIAGNOSIS — E039 Hypothyroidism, unspecified: Secondary | ICD-10-CM

## 2014-11-15 DIAGNOSIS — Z1211 Encounter for screening for malignant neoplasm of colon: Secondary | ICD-10-CM | POA: Diagnosis not present

## 2014-11-15 DIAGNOSIS — Z01419 Encounter for gynecological examination (general) (routine) without abnormal findings: Secondary | ICD-10-CM | POA: Diagnosis not present

## 2014-11-15 DIAGNOSIS — F32A Depression, unspecified: Secondary | ICD-10-CM

## 2014-11-15 DIAGNOSIS — R636 Underweight: Secondary | ICD-10-CM

## 2014-11-15 DIAGNOSIS — Z0000 Encounter for general adult medical examination without abnormal findings: Secondary | ICD-10-CM

## 2014-11-15 MED ORDER — VENLAFAXINE HCL ER 75 MG PO CP24: ORAL_CAPSULE | ORAL | Status: DC

## 2014-11-15 MED ORDER — ALPRAZOLAM 0.5 MG PO TABS: 0.5000 mg | ORAL_TABLET | Freq: Three times a day (TID) | ORAL | Status: DC | PRN

## 2014-11-15 MED ORDER — ESCITALOPRAM OXALATE 20 MG PO TABS: 20.0000 mg | ORAL_TABLET | Freq: Every day | ORAL | Status: DC

## 2014-11-15 NOTE — Progress Notes (Addendum)
   Subjective:    Patient ID: Roberta Brooks, female    DOB: [birth year], 49 y.o.   MRN: [id number]  HPI Patient in today for annual physical exam and PAP- She is doing well. Her currnet medical problems include: -Depression- currently on lexapro and effexor daily- she also takes xanax at least BID- she is doing well on current combination. -hypothyroidism- is currently taking synthroid 25mcg daily-  No c/o side effects.    Review of Systems  Constitutional: Negative.   HENT: Negative.   Respiratory: Negative.   Cardiovascular: Negative.   Gastrointestinal: Negative.   Genitourinary: Negative.   Neurological: Negative.   Psychiatric/Behavioral: Negative.   All other systems reviewed and are negative.      Objective:   Physical Exam  Constitutional: She is oriented to person, place, and time. She appears well-developed and well-nourished.  HENT:  Head: Normocephalic.  Right Ear: Hearing, tympanic membrane, external ear and ear canal normal.  Left Ear: Hearing, tympanic membrane, external ear and ear canal normal.  Nose: Nose normal.  Mouth/Throat: Uvula is midline and oropharynx is clear and moist.  Eyes: Conjunctivae and EOM are normal. Pupils are equal, round, and reactive to light.  Neck: Normal range of motion and full passive range of motion without pain. Neck supple. No JVD present. Carotid bruit is not present. No thyroid mass and no thyromegaly present.  Cardiovascular: Normal rate, normal heart sounds and intact distal pulses.   No murmur heard. Pulmonary/Chest: Effort normal and breath sounds normal. Right breast exhibits no inverted nipple, no mass, no nipple discharge, no skin change and no tenderness. Left breast exhibits no inverted nipple, no mass, no nipple discharge, no skin change and no tenderness.  Abdominal: Soft. Bowel sounds are normal. She exhibits no mass. There is no tenderness.  Genitourinary: Vagina normal and uterus normal. No breast swelling,  tenderness, discharge or bleeding.  Cervix parous and pink with friablity around os bimanual exam no adnexal masses or tenderness   Musculoskeletal: Normal range of motion.  Lymphadenopathy:    She has no cervical adenopathy.  Neurological: She is alert and oriented to person, place, and time.  Skin: Skin is warm and dry.  Psychiatric: She has a normal mood and affect. Her behavior is normal. Judgment and thought content normal.   BP 132/67 mmHg  Pulse 71  Temp(Src) 97.1 F (36.2 C) (Oral)  Ht 5\' 4"  (1.626 m)  Wt 81 lb 9.6 oz (37.014 kg)  BMI 14.00 kg/m2     Assessment & Plan:  1. Annual physical exam ment 2. Depression Stress manage - venlafaxine XR (EFFEXOR-XR) 75 MG 24 hr capsule; TAKE 1 CAPSULE (75 MG TOTAL) BY MOUTH DAILY. MUST BE SEEN FOR MORE REFILLS  Dispense: 90 capsule; Refill: 1 - escitalopram (LEXAPRO) 20 MG tablet; Take 1 tablet (20 mg total) by mouth daily.  Dispense: 30 tablet; Refill: 5 - ALPRAZolam (XANAX) 0.5 MG tablet; Take 1 tablet (0.5 mg total) by mouth 3 (three) times daily as needed.  Dispense: 90 tablet; Refill: 0  3. Hypothyroidism, unspecified hypothyroidism type  4. Underweight Discussed diet- need sto increase calories  5. Encounter for screening colonoscopy - Ambulatory referral to Gastroenterology    Labs pending Health maintenance reviewed Diet and exercise encouraged Continue all meds Follow up  In 6 months   [place], FNP

## 2014-11-15 NOTE — Addendum Note (Signed)
Addended by: Chevis Pretty on: 11/15/2014 11:52 AM   Modules accepted: Orders

## 2014-11-15 NOTE — Patient Instructions (Signed)
High Protein, High Calorie Diet A high protein, high calorie diet increases the amount of protein and calories you eat. You may need more protein and calories in your diet because of illness, surgery, injury, weight loss, or having a poor appetite. Eating high protein and high calorie foods can help you gain weight, heal, and recover after illness.  SERVING SIZES Measuring foods and serving sizes helps to make sure you are getting the right amount of food. The list below tells how big or small some common serving sizes are.   1 oz.........4 stacked dice.  3 oz.........Deck of cards.  1 tsp........Tip of little finger.  1 tbs........Thumb.  2 tbs........Golf ball.   cup.......Half of a fist.  1 cup........A fist. HIGH PROTEIN FOODS Dairy  Whole milk.  Whole milk yogurt.  Powdered milk.  Cheese.  Cottage Cheese.  Instant breakfast products.  Eggnog. Tips for adding to your diet:  Use whole milk when making hot cereal, puddings, soups, and hot cocoa.  Add powdered milk to baked goods, smoothies, and milkshakes.  Make whole milk yogurt parfaits by adding granola, fruit, or nuts.  Add cheese to sandwiches, pastas, soups, and casseroles.  Add fruit to cottage cheese. Meat   Beef, pork, and poultry.  Fish and seafood.  Peanut butter.  Dried beans.  Eggs. Tips for adding to your diet:  Make meat and cheese omelets.  Add eggs to salads and baked goods.  Add fish and seafood to salads.  Add meat and poultry to casseroles, salads, and soups.  Use peanut butter as a topping for pretzels, celery, crackers, or add it to baked goods.  Use beans in casseroles, dips, and spreads. GENERAL GUIDELINES TO INCREASE CALORIES  Replace calorie-free drinks with calorie-containing drinks, such as milk, fruit juices, regular soda, milkshakes, and hot chocolate.  Try to eat 6 small meals instead of 3 large meals each day.  Keep snacks handy, such as nuts, trail mixes,  dried fruit, and yogurt.  Choose foods with sauces and gravies.  Add dried fruits, honey, and half-and-half to hot or cold cereal.  Add extra fats when possible, such as butter, sour cream, cream cheese, and salad dressings.  Add cheese to foods often.  Consider adding a clear liquid nutritional supplement to your diet. Your caregiver can give you recommendations. HIGH CALORIE FOODS Grain/Starch  Baked goods, such as muffins and quick breads.  Croissants.  Pancakes and waffles. Vegetable   Sauted vegetables in oil.  Fried vegetables.  Salad greens with regular salad dressing or vinegar and oil. Fruit  Dried fruit.  Canned fruit in syrup.  Fruit juice. Fat  Avocado.  Butter or margarine.  Whipped cream.  Mayonnaise.  Salad dressing.  Peanuts and mixed nuts.  Cream cheese and sour cream. Sweets and Dessert  Cake.  Cookies.  Pie.  Ice cream.  Doughnuts and pastries.  Protein and meal replacement bars.  Jam, preserves, and jelly.  Candy bars.  Chocolate.  Chocolate, caramel, or other flavored syrups. Document Released: 02/11/2005 Document Revised: 05/06/2011 Document Reviewed: 11/14/2006 Exit[employer] Patient Information 2015 [employer]. This information is not intended to replace advice given to you by your health care Shamarion Coots. Make sure you discuss any questions you have with your health care Eryk Beavers.  

## 2014-11-16 ENCOUNTER — Other Ambulatory Visit: Payer: Self-pay | Admitting: Nurse Practitioner

## 2014-11-16 ENCOUNTER — Encounter: Payer: Self-pay | Admitting: Gastroenterology

## 2014-11-16 LAB — CBC WITH DIFFERENTIAL/PLATELET
BASOS ABS: 0 10*3/uL (ref 0.0–0.2)
Basos: 1 %
EOS (ABSOLUTE): 0 10*3/uL (ref 0.0–0.4)
Eos: 1 %
HEMATOCRIT: 42.4 % (ref 34.0–46.6)
Hemoglobin: 14 g/dL (ref 11.1–15.9)
Immature Grans (Abs): 0 10*3/uL (ref 0.0–0.1)
Immature Granulocytes: 0 %
LYMPHS: 36 %
Lymphocytes Absolute: 1.8 10*3/uL (ref 0.7–3.1)
MCH: 32 pg (ref 26.6–33.0)
MCHC: 33 g/dL (ref 31.5–35.7)
MCV: 97 fL (ref 79–97)
MONOCYTES: 8 %
Monocytes Absolute: 0.4 10*3/uL (ref 0.1–0.9)
NEUTROS ABS: 2.8 10*3/uL (ref 1.4–7.0)
NEUTROS PCT: 54 %
Platelets: 165 10*3/uL (ref 150–379)
RBC: 4.37 x10E6/uL (ref 3.77–5.28)
RDW: 13.7 % (ref 12.3–15.4)
WBC: 5.1 10*3/uL (ref 3.4–10.8)

## 2014-11-16 LAB — CMP14+EGFR
A/G RATIO: 1.8 (ref 1.1–2.5)
ALK PHOS: 61 IU/L (ref 39–117)
ALT: 8 IU/L (ref 0–32)
AST: 16 IU/L (ref 0–40)
Albumin: 4.4 g/dL (ref 3.5–5.5)
BUN / CREAT RATIO: 29 — AB (ref 9–23)
BUN: 22 mg/dL (ref 6–24)
Bilirubin Total: 0.5 mg/dL (ref 0.0–1.2)
CALCIUM: 9.4 mg/dL (ref 8.7–10.2)
CO2: 24 mmol/L (ref 18–29)
Chloride: 100 mmol/L (ref 97–108)
Creatinine, Ser: 0.76 mg/dL (ref 0.57–1.00)
GFR calc Af Amer: 106 mL/min/{1.73_m2} (ref >59)
GFR, EST NON AFRICAN AMERICAN: 92 mL/min/{1.73_m2} (ref >59)
GLOBULIN, TOTAL: 2.5 g/dL (ref 1.5–4.5)
Glucose: 84 mg/dL (ref 65–99)
POTASSIUM: 4.2 mmol/L (ref 3.5–5.2)
SODIUM: 141 mmol/L (ref 134–144)
Total Protein: 6.9 g/dL (ref 6.0–8.5)

## 2014-11-16 LAB — LIPID PANEL
CHOL/HDL RATIO: 3.9 ratio (ref 0.0–4.4)
Cholesterol, Total: 189 mg/dL (ref 100–199)
HDL: 49 mg/dL (ref >39)
LDL Calculated: 119 mg/dL — ABNORMAL HIGH (ref 0–99)
TRIGLYCERIDES: 106 mg/dL (ref 0–149)
VLDL Cholesterol Cal: 21 mg/dL (ref 5–40)

## 2014-11-16 LAB — THYROID PANEL WITH TSH
Free Thyroxine Index: 3.5 (ref 1.2–4.9)
T3 Uptake Ratio: 27 % (ref 24–39)
T4, Total: 12.8 ug/dL — ABNORMAL HIGH (ref 4.5–12.0)
TSH: 0.181 u[IU]/mL — ABNORMAL LOW (ref 0.450–4.500)

## 2014-11-16 MED ORDER — LEVOTHYROXINE SODIUM 50 MCG PO TABS: 50.0000 ug | ORAL_TABLET | Freq: Every day | ORAL | Status: DC

## 2014-11-18 LAB — PAP IG W/ RFLX HPV ASCU: PAP Smear Comment: 0

## 2014-11-30 ENCOUNTER — Other Ambulatory Visit: Payer: Self-pay

## 2014-11-30 DIAGNOSIS — F32A Depression, unspecified: Secondary | ICD-10-CM

## 2014-11-30 DIAGNOSIS — F329 Major depressive disorder, single episode, unspecified: Secondary | ICD-10-CM

## 2014-11-30 MED ORDER — ESCITALOPRAM OXALATE 20 MG PO TABS: 20.0000 mg | ORAL_TABLET | Freq: Every day | ORAL | Status: DC

## 2014-12-14 ENCOUNTER — Ambulatory Visit (AMBULATORY_SURGERY_CENTER): Payer: Self-pay | Admitting: *Deleted

## 2014-12-14 VITALS — Ht 64.0 in | Wt 80.2 lb

## 2014-12-14 DIAGNOSIS — Z1211 Encounter for screening for malignant neoplasm of colon: Secondary | ICD-10-CM

## 2014-12-14 MED ORDER — NA SULFATE-K SULFATE-MG SULF 17.5-3.13-1.6 GM/177ML PO SOLN: 1.0000 | Freq: Once | ORAL | Status: DC

## 2014-12-14 NOTE — Progress Notes (Signed)
No egg or soy allergy No issues with past sedation No diet pills No home 02 use  emmi declined  

## 2014-12-26 ENCOUNTER — Ambulatory Visit (INDEPENDENT_AMBULATORY_CARE_PROVIDER_SITE_OTHER): Payer: Medicare Other

## 2014-12-26 DIAGNOSIS — Z23 Encounter for immunization: Secondary | ICD-10-CM | POA: Diagnosis not present

## 2014-12-26 DIAGNOSIS — Z1231 Encounter for screening mammogram for malignant neoplasm of breast: Secondary | ICD-10-CM | POA: Diagnosis not present

## 2014-12-26 LAB — HM MAMMOGRAPHY: HM Mammogram: NEGATIVE

## 2014-12-28 ENCOUNTER — Encounter: Payer: Medicare Other | Admitting: Gastroenterology

## 2015-01-02 ENCOUNTER — Encounter: Payer: Self-pay | Admitting: *Deleted

## 2015-01-10 ENCOUNTER — Telehealth: Payer: Self-pay | Admitting: Nurse Practitioner

## 2015-01-10 NOTE — Telephone Encounter (Signed)
Called and informed patient she can take ibuprofen or tylenol and to try alternating between ice pack and heating pad.  If pain does not get any better to call us back.

## 2015-01-15 ENCOUNTER — Other Ambulatory Visit: Payer: Self-pay | Admitting: Nurse Practitioner

## 2015-01-16 NOTE — Telephone Encounter (Signed)
Last filled 11/30/14, last seen 11/15/14. [facility] at [facility]

## 2015-01-16 NOTE — Telephone Encounter (Signed)
rx called into pharmacy

## 2015-01-16 NOTE — Telephone Encounter (Signed)
Please call in xanax with 1 refills 

## 2015-01-26 ENCOUNTER — Ambulatory Visit (AMBULATORY_SURGERY_CENTER): Payer: Medicare Other | Admitting: Gastroenterology

## 2015-01-26 ENCOUNTER — Telehealth: Payer: Self-pay | Admitting: Gastroenterology

## 2015-01-26 ENCOUNTER — Encounter: Payer: Self-pay | Admitting: Gastroenterology

## 2015-01-26 VITALS — BP 108/57 | HR 69 | Temp 97.4°F | Resp 20 | Ht 64.0 in | Wt 80.0 lb

## 2015-01-26 DIAGNOSIS — K635 Polyp of colon: Secondary | ICD-10-CM | POA: Diagnosis not present

## 2015-01-26 DIAGNOSIS — Z1211 Encounter for screening for malignant neoplasm of colon: Secondary | ICD-10-CM

## 2015-01-26 DIAGNOSIS — F329 Major depressive disorder, single episode, unspecified: Secondary | ICD-10-CM | POA: Diagnosis not present

## 2015-01-26 DIAGNOSIS — D128 Benign neoplasm of rectum: Secondary | ICD-10-CM

## 2015-01-26 DIAGNOSIS — E039 Hypothyroidism, unspecified: Secondary | ICD-10-CM | POA: Diagnosis not present

## 2015-01-26 DIAGNOSIS — K621 Rectal polyp: Secondary | ICD-10-CM | POA: Diagnosis not present

## 2015-01-26 MED ORDER — SODIUM CHLORIDE 0.9 % IV SOLN: 500.0000 mL | INTRAVENOUS | Status: DC

## 2015-01-26 NOTE — Op Note (Signed)
[place] [facility]  [address] [place], [address]   COLONOSCOPY PROCEDURE REPORT  PATIENT: Roberta Brooks, Roberta Brooks  MR#: [id number] BIRTHDATE: 12/17/1965 , 49  yrs. old GENDER: female ENDOSCOPIST: Harl Bowie, MD REFERRED [id number] Hassell Done, M.D. PROCEDURE DATE:  01/26/2015 PROCEDURE:   Colonoscopy, screening and Colonoscopy with cold biopsy polypectomy First Screening Colonoscopy - Avg.  risk and is 50 yrs.  old or older Yes.  Prior Negative Screening - Now for repeat screening. 10 or more years since last screening  History of Adenoma - Now for follow-up colonoscopy & has been > or = to 3 yrs.  N/A  Polyps removed today? Yes ASA CLASS:   Class II INDICATIONS:Screening for colonic neoplasia and Colorectal Neoplasm Risk Assessment for this procedure is average risk. MEDICATIONS: Propofol 170 IV  DESCRIPTION OF PROCEDURE:   After the risks benefits and alternatives of the procedure were thoroughly explained, informed consent was obtained.  The digital rectal exam revealed no abnormalities of the rectum except weak rectal tone.Redness over her sacrum concerning for potential for decubitus ulcer   The LB [id number]  endoscope was introduced through the anus and advanced to the cecum, which was identified by both the appendix and ileocecal valve. No adverse events experienced.   The quality of the prep was good.  The instrument was then slowly withdrawn as the colon was fully examined. Estimated blood loss is zero unless otherwise noted in this procedure report.   COLON FINDINGS: A sessile polyp ranging between 3-11mm in size was found in the rectum.  A polypectomy was performed with cold forceps.  The resection was complete, the polyp tissue was completely retrieved and sent to histology.  Retroflexed views revealed small internal hemorrhoids and very poor anal sphincter tone. The time to cecum = 6.1 Withdrawal time = 9.2   The scope was withdrawn and the procedure  completed. COMPLICATIONS: There were no immediate complications.  ENDOSCOPIC IMPRESSION: Sessile polyp ranging between 3-21mm in size was found in the rectum; polypectomy was performed with cold forceps  RECOMMENDATIONS: If the polyp(s) removed today are proven to be adenomatous (pre-cancerous) polyps, you will need a repeat colonoscopy in 5 years.  Otherwise you should continue to follow colorectal cancer screening guidelines for "routine risk" patients with colonoscopy in 10 years.  You will receive a letter within 1-2 weeks with the results of your biopsy as well as final recommendations.  Please call my office if you have not received a letter after 3 weeks.  eSigned:  Harl Bowie, MD 01/26/2015 4:02 PM

## 2015-01-26 NOTE — Progress Notes (Signed)
Report to PACU, RN, vss, BBS= Clear.  

## 2015-01-26 NOTE — Progress Notes (Signed)
Called to room to assist during endoscopic procedure.  Patient ID and intended procedure confirmed with present staff. Received instructions for my participation in the procedure from the performing physician.  

## 2015-01-26 NOTE — Patient Instructions (Signed)
YOU HAD AN ENDOSCOPIC PROCEDURE TODAY AT [facility]:   Refer to the procedure report that was given to you for any specific questions about what was found during the examination.  If the procedure report does not answer your questions, please call your gastroenterologist to clarify.  If you requested that your care partner not be given the details of your procedure findings, then the procedure report has been included in a sealed envelope for you to review at your convenience later.  YOU SHOULD EXPECT: Some feelings of bloating in the abdomen. Passage of more gas than usual.  Walking can help get rid of the air that was put into your GI tract during the procedure and reduce the bloating. If you had a lower endoscopy (such as a colonoscopy or flexible sigmoidoscopy) you may notice spotting of blood in your stool or on the toilet paper. If you underwent a bowel prep for your procedure, you may not have a normal bowel movement for a few days.  Please Note:  You might notice some irritation and congestion in your nose or some drainage.  This is from the oxygen used during your procedure.  There is no need for concern and it should clear up in a day or so.  SYMPTOMS TO REPORT IMMEDIATELY:   Following lower endoscopy (colonoscopy or flexible sigmoidoscopy):  Excessive amounts of blood in the stool  Significant tenderness or worsening of abdominal pains  Swelling of the abdomen that is new, acute  Fever of 100F or higher   For urgent or emergent issues, a gastroenterologist can be reached at any hour by calling [phone].   DIET: Your first meal following the procedure should be a small meal and then it is ok to progress to your normal diet. Heavy or fried foods are harder to digest and may make you feel nauseous or bloated.  Likewise, meals heavy in dairy and vegetables can increase bloating.  Drink plenty of fluids but you should avoid alcoholic beverages for 24  hours.  ACTIVITY:  You should plan to take it easy for the rest of today and you should NOT DRIVE or use heavy machinery until tomorrow (because of the sedation medicines used during the test).    FOLLOW UP: Our staff will call the number listed on your records the next business day following your procedure to check on you and address any questions or concerns that you may have regarding the information given to you following your procedure. If we do not reach you, we will leave a message.  However, if you are feeling well and you are not experiencing any problems, there is no need to return our call.  We will assume that you have returned to your regular daily activities without incident.  If any biopsies were taken you will be contacted by phone or by letter within the next 1-3 weeks.  Please call us at [phone] if you have not heard about the biopsies in 3 weeks.    SIGNATURES/CONFIDENTIALITY: You and/or your care partner have signed paperwork which will be entered into your electronic medical record.  These signatures attest to the fact that that the information above on your After Visit Summary has been reviewed and is understood.  Full responsibility of the confidentiality of this discharge information lies with you and/or your care-partner.  Polyp, hemorrhoids-handouts given  Repeat colonoscopy will be determined by pathology.

## 2015-01-26 NOTE — Telephone Encounter (Signed)
Returned patient's call and she states that she could not hold down all of the second portion of prep.  I asked her what did her stools look like and she stated they were clear. I advised her that she will be fine to continue with procedure.  She will arrive at 2:30 for a 3:30 colonoscopy today.

## 2015-01-27 ENCOUNTER — Telehealth: Payer: Self-pay | Admitting: *Deleted

## 2015-01-27 NOTE — Telephone Encounter (Signed)
  Follow up Call-  Call back number 01/26/2015  Post procedure Call Back phone  # [phone]  Permission to leave phone message No     Patient questions:  Do you have a fever, pain , or abdominal swelling? No. Pain Score  0 *  Have you tolerated food without any problems? Yes.    Have you been able to return to your normal activities? Yes.    Do you have any questions about your discharge instructions: Diet   No. Medications  No. Follow up visit  No.  Do you have questions or concerns about your Care? No.  Actions: * If pain score is 4 or above: No action needed, pain <4.

## 2015-02-07 ENCOUNTER — Encounter: Payer: Self-pay | Admitting: Gastroenterology

## 2015-02-26 ENCOUNTER — Other Ambulatory Visit: Payer: Self-pay | Admitting: Family Medicine

## 2015-04-13 ENCOUNTER — Ambulatory Visit (INDEPENDENT_AMBULATORY_CARE_PROVIDER_SITE_OTHER): Payer: Medicare Other | Admitting: Nurse Practitioner

## 2015-04-13 ENCOUNTER — Emergency Department (HOSPITAL_COMMUNITY)
Admission: EM | Admit: 2015-04-13 | Discharge: 2015-04-13 | Disposition: A | Discharge status: Home / Self Care | Payer: Medicare Other | Attending: Emergency Medicine | Admitting: Emergency Medicine

## 2015-04-13 ENCOUNTER — Ambulatory Visit (HOSPITAL_COMMUNITY): Admission: RE | Admit: 2015-04-13 | Payer: Medicare Other | Source: Home / Self Care | Admitting: Psychiatry

## 2015-04-13 ENCOUNTER — Encounter (HOSPITAL_COMMUNITY): Payer: Self-pay

## 2015-04-13 ENCOUNTER — Encounter: Payer: Self-pay | Admitting: Nurse Practitioner

## 2015-04-13 VITALS — BP 99/64 | HR 62 | Temp 97.4°F | Ht 64.0 in | Wt 73.0 lb

## 2015-04-13 DIAGNOSIS — R531 Weakness: Secondary | ICD-10-CM | POA: Diagnosis not present

## 2015-04-13 DIAGNOSIS — F509 Eating disorder, unspecified: Secondary | ICD-10-CM | POA: Diagnosis not present

## 2015-04-13 DIAGNOSIS — F17210 Nicotine dependence, cigarettes, uncomplicated: Secondary | ICD-10-CM | POA: Insufficient documentation

## 2015-04-13 DIAGNOSIS — R630 Anorexia: Secondary | ICD-10-CM | POA: Diagnosis not present

## 2015-04-13 LAB — CBC
HEMATOCRIT: 40.8 % (ref 36.0–46.0)
HEMOGLOBIN: 14 g/dL (ref 12.0–15.0)
MCH: 33.7 pg (ref 26.0–34.0)
MCHC: 34.3 g/dL (ref 30.0–36.0)
MCV: 98.1 fL (ref 78.0–100.0)
PLATELETS: 127 10*3/uL — AB (ref 150–400)
RBC: 4.16 MIL/uL (ref 3.87–5.11)
RDW: 13.1 % (ref 11.5–15.5)
WBC: 4.6 10*3/uL (ref 4.0–10.5)

## 2015-04-13 LAB — ETHANOL: Alcohol, Ethyl (B): <5 mg/dL (ref <5)

## 2015-04-13 LAB — COMPREHENSIVE METABOLIC PANEL
ALBUMIN: 4.5 g/dL (ref 3.5–5.0)
ALK PHOS: 45 U/L (ref 38–126)
ALT: 25 U/L (ref 14–54)
AST: 34 U/L (ref 15–41)
Anion gap: 8 (ref 5–15)
BUN: 34 mg/dL — ABNORMAL HIGH (ref 6–20)
CALCIUM: 9.2 mg/dL (ref 8.9–10.3)
CO2: 24 mmol/L (ref 22–32)
CREATININE: 1.06 mg/dL — AB (ref 0.44–1.00)
Chloride: 105 mmol/L (ref 101–111)
GFR calc Af Amer: >60 mL/min (ref >60)
GFR calc non Af Amer: >60 mL/min (ref >60)
GLUCOSE: 85 mg/dL (ref 65–99)
Potassium: 4.4 mmol/L (ref 3.5–5.1)
SODIUM: 137 mmol/L (ref 135–145)
Total Bilirubin: 1.2 mg/dL (ref 0.3–1.2)
Total Protein: 7.1 g/dL (ref 6.5–8.1)

## 2015-04-13 LAB — ACETAMINOPHEN LEVEL: Acetaminophen (Tylenol), Serum: <10 ug/mL — ABNORMAL LOW (ref 10–30)

## 2015-04-13 LAB — SALICYLATE LEVEL: Salicylate Lvl: <4 mg/dL (ref 2.8–30.0)

## 2015-04-13 NOTE — ED Notes (Signed)
Pt not eating.  Pt eats maybe one small meal a day.  Pt went to md and told she needs psych admission.  Has lost 30 pounds in a few months.  Also having weakness.

## 2015-04-13 NOTE — ED Notes (Signed)
Pt called for room, no answer.

## 2015-04-13 NOTE — Progress Notes (Signed)
   Subjective:    Patient ID: Roberta Brooks, female    DOB: 1965-07-26, 50 y.o.   MRN: [id number]  HPI  Patient in for weight loss - in March 2016 she weighed 109- today she weighs 73lbs. All she is doing is drinking and ensure in the mornings and eating 2 pieces of bread. Patient says she is weak and does not fell like fixing anything to eat. All she had to eat yesterday was a piece of beef and few potatoes and a roll. Can no longer afford ensure.   Review of Systems  Constitutional: Negative.   HENT: Negative.   Respiratory: Negative.   Cardiovascular: Negative.   Genitourinary: Negative.   Neurological: Negative.   Psychiatric/Behavioral: Negative.   All other systems reviewed and are negative.      Objective:   Physical Exam  Constitutional: She is oriented to person, place, and time. She appears well-developed and well-nourished.  Cardiovascular: Normal rate, regular rhythm and normal heart sounds.   Pulmonary/Chest: Effort normal.  Musculoskeletal:  Patient skin and bones- no fat on her body  Neurological: She is alert and oriented to person, place, and time.  Skin: Skin is warm.  Psychiatric: She has a normal mood and affect. Her behavior is normal. Thought content normal.   BP 99/64 mmHg  Pulse 62  Temp(Src) 97.4 F (36.3 C) (Oral)  Ht 5\' 4"  (1.626 m)  Wt 73 lb (33.113 kg)  BMI 12.52 kg/m2        Assessment & Plan:   1. Anorexia    Referral to in house treatment facility- sent to  health RTO prn  Mary-Margaret Hassell Done, FNP

## 2015-04-13 NOTE — ED Notes (Signed)
Pt not found in lobby 

## 2015-04-13 NOTE — Patient Instructions (Signed)
Anorexia Nervosa Anorexia nervosa is an eating disorder that results in a lower-than-normal body weight. The disorder usually starts in the teenage years. People with anorexia nervosa are intensely afraid of gaining weight or being fat. They often see themselves as fat even though they may be very thin. In order to prevent weight gain or to lose weight, they engage in unhealthy behaviors, such as:  Starving themselves.  Fasting.  Exercising too much.  Making themselves throw up (vomit) after eating. These behaviors often interfere with normal life activities. They can lead to serious medical problems and even death. People with anorexia nervosa are at risk for substance abuse and death due to suicide. CAUSES The cause is unknown, but the disorder probably results from a combination of factors, such as:  Depression.  Other psychological problems.  Peer pressure.  Hormone changes at puberty.  Stress. RISK FACTORS Risk factors include:  Being a teenager.  Being female. Anorexia nervosa can affect males, but it is more common in females.  Having a mental health disorder, such as depression or anxiety.  Having a family member with anorexia nervosa. SIGNS AND SYMPTOMS Signs and symptoms include:  Lower-than-normal body weight.  An intense fear of gaining weight or being fat.  Having a distorted body image.  Basing self-worth on being thin.  Wearing lots of clothes to hide your body.  Being in denial that your low body weight is a problem.  Eating in secret.  Binge eating.  Checking your body several times a day by:  Looking in a mirror.  Pinching the skin on your sides.  Weighing yourself.  Doing things that prevent weight gain, such as:  Restricting your calorie intake. This may be done by starving yourself, fasting, or exercising too much.  Making yourself vomit.  Using too many laxatives.  Using water pills inappropriately. DIAGNOSIS To make a  diagnosis, your health care provider will:  Ask about:  Your thoughts, feelings, and eating habits.  Your symptoms.  Your use of medicine, alcohol, or other substances.  Measure your weight and check your vital signs.  Perform a physical exam. Your health care provider may also order tests or studies to look for health problems. You may be referred to a mental health specialist for evaluation. Once you have been diagnosed, your level of anorexia nervosa will be rated from mild to severe. The rating depends on your degree of weight loss compared to other people of your age, gender, and stage of development. TREATMENT The first goal of treatment is to stabilize medical problems and mental health issues related to the disorder. The type and length of treatment will depend on your specific situation. You may need to be treated at the hospital if you have:  Severe malnutrition.  Dehydration.  An electrolyte imbalance.  An abnormal heart rhythm.  Depression.  Suicidal thoughts. The second goal of treatment is to restore your body weight to a healthy level. Successful treatment usually requires a combination of:  Counseling with a dietitian.  Counseling or talk therapy with a mental health specialist.A form of talk therapy called cognitive behavioral therapy can be especially helpful. This therapy helps you recognize the thoughts, beliefs, and emotions that lead to unhealthy eating habits and helps you change them.  Medicine. Some people with severe anorexia nervosa may benefit from certain medicines that promote weight gain. Antidepressant medicines can help with symptoms of depression and anxiety. HOME CARE INSTRUCTIONS  Keep all follow-up visits as directed by your health  care provider. This is important.  Follow a meal plan as directed by your health care provider.  Avoid checking your weight.  Avoid isolating yourself from your family and friends.  Take medicines only as  directed by your health care provider. SEEK MEDICAL CARE IF:  Your symptoms get worse.  You start having new symptoms. SEEK IMMEDIATE MEDICAL CARE IF:  You have serious thoughts about hurting yourself or someone else.  You collapse.  You have chest pain.  You vomit blood. MAKE SURE YOU:  Understand these instructions.  Will watch your condition.  Will get help right away if you are not doing well or get worse. FOR MORE INFORMATION: National Eating Disorders Association (NEDA): www.nationaleatingdisorders.org   This information is not intended to replace advice given to you by your health care provider. Make sure you discuss any questions you have with your health care provider.   Document Released: 02/09/2000 Document Revised: 03/04/2014 Document Reviewed: 06/10/2013 Elsevier Interactive Patient Education [employer].

## 2015-04-14 ENCOUNTER — Other Ambulatory Visit: Payer: Self-pay | Admitting: Nurse Practitioner

## 2015-04-14 MED ORDER — MEGESTROL ACETATE 40 MG PO TABS: 40.0000 mg | ORAL_TABLET | Freq: Every day | ORAL | Status: DC

## 2015-04-17 NOTE — Telephone Encounter (Signed)
Please call in xanax with 1 refills Make sure patient has appointment to be seen this week

## 2015-04-17 NOTE — Telephone Encounter (Signed)
Last filled 02/26/15, last seen 04/13/15. Call in at [facility]

## 2015-04-25 ENCOUNTER — Ambulatory Visit (INDEPENDENT_AMBULATORY_CARE_PROVIDER_SITE_OTHER): Payer: Medicare Other | Admitting: Nurse Practitioner

## 2015-04-25 ENCOUNTER — Encounter: Payer: Self-pay | Admitting: Nurse Practitioner

## 2015-04-25 ENCOUNTER — Ambulatory Visit (INDEPENDENT_AMBULATORY_CARE_PROVIDER_SITE_OTHER): Payer: Medicare Other

## 2015-04-25 VITALS — BP 109/68 | HR 78 | Temp 98.2°F | Ht 64.0 in | Wt 75.0 lb

## 2015-04-25 DIAGNOSIS — R634 Abnormal weight loss: Secondary | ICD-10-CM

## 2015-04-25 NOTE — Progress Notes (Signed)
   Subjective:    Patient ID: Roberta Brooks, female    DOB: 27-Mar-1965, 50 y.o.   MRN: [id number]  HPI Patient in for follow up of weight loss- she was seen 04/13/15 with extreme weight loss- says she just could make herself eat- she went straight to behavior health and they sent her to ER said they did not think was eating disorder but was physical causes. SHe wasin er fr several hours and got tired of waiting so she went home. I called her and she said she was going to try to eat and agreed to come back in a week. Today she says that she is eating 3 meals a day and family watches her eat. She has actually gained to lbs.    Review of Systems  Constitutional: Negative.   HENT: Negative.   Respiratory: Negative.   Cardiovascular: Negative.   Genitourinary: Negative.   Neurological: Negative.   Psychiatric/Behavioral: Negative.   All other systems reviewed and are negative.      Objective:   Physical Exam  Constitutional: She appears well-developed and well-nourished. No distress.  Cardiovascular: Normal rate, regular rhythm and normal heart sounds.   Pulmonary/Chest: Effort normal and breath sounds normal.  Neurological: She is alert.  Skin: Skin is warm.  Psychiatric: She has a normal mood and affect. Her behavior is normal. Judgment and thought content normal.   BP 109/68 mmHg  Pulse 78  Temp(Src) 98.2 F (36.8 C) (Oral)  Ht 5\' 4"  (1.626 m)  Wt 75 lb (34.02 kg)  BMI 12.87 kg/m2  Chest x ray- negative chest x ray-Preliminary reading by Ronnald Collum, FNP  [facility]       Assessment & Plan:  1. Loss of weight Continue megace- eat 3 meals a day Follow up in 2 weeks for weight check Smoking cessation encouraged - DG Chest 2 View; Future - [place], FNP

## 2015-04-25 NOTE — Patient Instructions (Signed)
High-Protein and High-Calorie Diet Eating high-protein and high-calorie foods can help you to gain weight, heal after an injury, and recover after an illness or surgery.  WHAT IS MY PLAN? The specific amount of daily protein and calories you need depends on:  Your body weight.  The reason this diet is recommended for you. Generally, a high-protein, high-calorie diet involves:   Eating 250-500 extra calories each day.  Making sure that 10-35% of your daily calories come from protein. Talk to your health care provider about how much protein and how many calories you need each day. Follow the diet as directed by your health care provider.  WHAT DO I NEED TO KNOW ABOUT THIS DIET?  Ask your health care provider if you should take a nutritional supplement.   Try to eat six small meals each day instead of three large meals.   Eat a balanced diet, including one food that is high in protein at each meal.  Keep nutritious snacks handy, such as nuts, trail mixes, dried fruit, and yogurt.   If you have kidney disease or diabetes, eating too much protein may put extra stress on your kidneys. Talk to your health care provider if you have either of those conditions. WHAT ARE SOME HIGH-PROTEIN FOODS? Grains Quinoa. Bulgur wheat. Vegetables Soybeans. Peas. Meats and Other Protein Sources Beef, pork, and poultry. Fish and seafood. Eggs. Tofu. Textured vegetable protein (TVP). Peanut butter. Nuts and seeds. Dried beans. Protein powders. Dairy Whole milk. Whole-milk yogurt. Powdered milk. Cheese. Cottage Cheese. Eggnog. Beverages High-protein supplement drinks. Soy milk. Other Protein bars. The items listed above may not be a complete list of recommended foods or beverages. Contact your dietitian for more options. WHAT ARE SOME HIGH-CALORIE FOODS? Grains Pasta. Quick breads. Muffins. Pancakes. Ready-to-eat cereal. Vegetables Vegetables cooked in oil or butter. Fried  potatoes. Fruits Dried fruit. Fruit leather. Canned fruit in syrup. Fruit juice. Avocados. Meats and Other Protein Sources Peanut butter. Nuts and seeds. Dairy Heavy cream. Whipped cream. Cream cheese. Sour cream. Ice cream. Custard. Pudding. Beverages Meal-replacement beverages. Nutrition shakes. Fruit juice. Sugar-sweetened soft drinks. Condiments Salad dressing. Mayonnaise. Alfredo sauce. Fruit preserves or jelly. Honey. Syrup. Sweets/Desserts Cake. Cookies. Pie. Pastries. Candy bars. Chocolate. Fats and Oils Butter or margarine. Oil. Gravy. Other Meal-replacement bars. The items listed above may not be a complete list of recommended foods or beverages. Contact your dietitian for more options. WHAT ARE SOME TIPS FOR INCLUDING HIGH-PROTEIN AND HIGH-CALORIE FOODS IN MY DIET?  Add whole milk, half-and-half, or heavy cream to cereal, pudding, soup, or hot cocoa.  Add whole milk to instant breakfast drinks.  Add peanut butter to oatmeal or smoothies.  Add powdered milk to baked goods, smoothies, or milkshakes.  Add powdered milk, cream, or butter to mashed potatoes.  Add cheese to cooked vegetables.  Make whole-milk yogurt parfaits. Top them with granola, fruit, or nuts.  Add cottage cheese to your fruit.  Add avocados, cheese, or both to sandwiches or salads.  Add meat, poultry, or seafood to rice, pasta, casseroles, salads, and soups.   Use mayonnaise when making egg salad, chicken salad, or tuna salad.  Use peanut butter as a topping for pretzels, celery, or crackers.  Add beans to casseroles, dips, and spreads.  Add pureed beans to sauces and soups.  Replace calorie-free drinks with calorie-containing drinks, such as milk and fruit juice.   This information is not intended to replace advice given to you by your health care provider. Make sure you discuss   any questions you have with your health care provider.   Document Released: 02/11/2005 Document  Revised: 03/04/2014 Document Reviewed: 07/27/2013 Elsevier Interactive Patient Education 2016 Elsevier Inc.  

## 2015-04-26 LAB — CA 125: CA 125: 11 U/mL (ref 0.0–38.1)

## 2015-05-09 ENCOUNTER — Ambulatory Visit (INDEPENDENT_AMBULATORY_CARE_PROVIDER_SITE_OTHER): Payer: Medicare Other | Admitting: Nurse Practitioner

## 2015-05-09 ENCOUNTER — Encounter: Payer: Self-pay | Admitting: Nurse Practitioner

## 2015-05-09 VITALS — BP 113/64 | HR 73 | Temp 97.8°F | Ht 64.0 in | Wt 76.4 lb

## 2015-05-09 DIAGNOSIS — R636 Underweight: Secondary | ICD-10-CM | POA: Diagnosis not present

## 2015-05-09 DIAGNOSIS — E039 Hypothyroidism, unspecified: Secondary | ICD-10-CM | POA: Diagnosis not present

## 2015-05-09 NOTE — Progress Notes (Signed)
   Subjective:    Patient ID: Roberta Brooks, female    DOB: 1965/12/17, 50 y.o.   MRN: [id number]  HPI This Is a follow-up visit for this patient, she was last seen 2 weeks ago. She is here for follow up on her weight, she weighed 76 lbs today which is one pound greater than what she weighed 2 weeks ago. Pt endorsed eating 3 meals a day and candies for snack, she also endorsed drinking ensure one a day. She did mention that she has more energy today and was able to take a shower today.  Review of Systems  Constitutional: Negative.   HENT: Negative.   Respiratory: Negative.   Cardiovascular: Negative.   Genitourinary: Negative.   Neurological: Negative.   Psychiatric/Behavioral: Negative.   All other systems reviewed and are negative.      Objective:   Physical Exam  Constitutional: She appears well-developed and well-nourished. No distress.  Cardiovascular: Normal rate, regular rhythm and normal heart sounds.   Pulmonary/Chest: Effort normal and breath sounds normal.  Neurological: She is alert.  Skin: Skin is warm.  Psychiatric: She has a normal mood and affect. Her behavior is normal. Judgment and thought content normal.   BP 113/64 mmHg  Pulse 73  Temp(Src) 97.8 F (36.6 C) (Oral)  Ht 5\' 4"  (1.626 m)  Wt 76 lb 6.4 oz (34.655 kg)  BMI 13.11 kg/m2  Chest x ray- negative chest x ray-Preliminary reading by Ronnald Collum, FNP  [facility]       Assessment & Plan:   1. Underweight Continue eating three meals a day with snacks.  Take ensure two times a day, preferable between meals - Thyroid Panel With TSH Mary-Margaret Hassell Done, FNP

## 2015-05-09 NOTE — Patient Instructions (Signed)
High-Protein and High-Calorie Diet Eating high-protein and high-calorie foods can help you to gain weight, heal after an injury, and recover after an illness or surgery.  WHAT IS MY PLAN? The specific amount of daily protein and calories you need depends on:  Your body weight.  The reason this diet is recommended for you. Generally, a high-protein, high-calorie diet involves:   Eating 250-500 extra calories each day.  Making sure that 10-35% of your daily calories come from protein. Talk to your health care provider about how much protein and how many calories you need each day. Follow the diet as directed by your health care provider.  WHAT DO I NEED TO KNOW ABOUT THIS DIET?  Ask your health care provider if you should take a nutritional supplement.   Try to eat six small meals each day instead of three large meals.   Eat a balanced diet, including one food that is high in protein at each meal.  Keep nutritious snacks handy, such as nuts, trail mixes, dried fruit, and yogurt.   If you have kidney disease or diabetes, eating too much protein may put extra stress on your kidneys. Talk to your health care provider if you have either of those conditions. WHAT ARE SOME HIGH-PROTEIN FOODS? Grains Quinoa. Bulgur wheat. Vegetables Soybeans. Peas. Meats and Other Protein Sources Beef, pork, and poultry. Fish and seafood. Eggs. Tofu. Textured vegetable protein (TVP). Peanut butter. Nuts and seeds. Dried beans. Protein powders. Dairy Whole milk. Whole-milk yogurt. Powdered milk. Cheese. Cottage Cheese. Eggnog. Beverages High-protein supplement drinks. Soy milk. Other Protein bars. The items listed above may not be a complete list of recommended foods or beverages. Contact your dietitian for more options. WHAT ARE SOME HIGH-CALORIE FOODS? Grains Pasta. Quick breads. Muffins. Pancakes. Ready-to-eat cereal. Vegetables Vegetables cooked in oil or butter. Fried  potatoes. Fruits Dried fruit. Fruit leather. Canned fruit in syrup. Fruit juice. Avocados. Meats and Other Protein Sources Peanut butter. Nuts and seeds. Dairy Heavy cream. Whipped cream. Cream cheese. Sour cream. Ice cream. Custard. Pudding. Beverages Meal-replacement beverages. Nutrition shakes. Fruit juice. Sugar-sweetened soft drinks. Condiments Salad dressing. Mayonnaise. Alfredo sauce. Fruit preserves or jelly. Honey. Syrup. Sweets/Desserts Cake. Cookies. Pie. Pastries. Candy bars. Chocolate. Fats and Oils Butter or margarine. Oil. Gravy. Other Meal-replacement bars. The items listed above may not be a complete list of recommended foods or beverages. Contact your dietitian for more options. WHAT ARE SOME TIPS FOR INCLUDING HIGH-PROTEIN AND HIGH-CALORIE FOODS IN MY DIET?  Add whole milk, half-and-half, or heavy cream to cereal, pudding, soup, or hot cocoa.  Add whole milk to instant breakfast drinks.  Add peanut butter to oatmeal or smoothies.  Add powdered milk to baked goods, smoothies, or milkshakes.  Add powdered milk, cream, or butter to mashed potatoes.  Add cheese to cooked vegetables.  Make whole-milk yogurt parfaits. Top them with granola, fruit, or nuts.  Add cottage cheese to your fruit.  Add avocados, cheese, or both to sandwiches or salads.  Add meat, poultry, or seafood to rice, pasta, casseroles, salads, and soups.   Use mayonnaise when making egg salad, chicken salad, or tuna salad.  Use peanut butter as a topping for pretzels, celery, or crackers.  Add beans to casseroles, dips, and spreads.  Add pureed beans to sauces and soups.  Replace calorie-free drinks with calorie-containing drinks, such as milk and fruit juice.   This information is not intended to replace advice given to you by your health care provider. Make sure you discuss   any questions you have with your health care provider.   Document Released: 02/11/2005 Document  Revised: 03/04/2014 Document Reviewed: 07/27/2013 Elsevier Interactive Patient Education 2016 Elsevier Inc.  

## 2015-05-10 LAB — THYROID PANEL WITH TSH
FREE THYROXINE INDEX: 2 (ref 1.2–4.9)
T3 UPTAKE RATIO: 25 % (ref 24–39)
T4 TOTAL: 7.9 ug/dL (ref 4.5–12.0)
TSH: 28.86 u[IU]/mL — AB (ref 0.450–4.500)

## 2015-05-11 ENCOUNTER — Other Ambulatory Visit: Payer: Self-pay | Admitting: Nurse Practitioner

## 2015-05-11 MED ORDER — LEVOTHYROXINE SODIUM 75 MCG PO CAPS: 1.0000 | ORAL_CAPSULE | Freq: Every day | ORAL | Status: DC

## 2015-05-16 ENCOUNTER — Encounter: Payer: Self-pay | Admitting: *Deleted

## 2015-05-23 ENCOUNTER — Encounter: Payer: Self-pay | Admitting: Nurse Practitioner

## 2015-05-23 ENCOUNTER — Ambulatory Visit (INDEPENDENT_AMBULATORY_CARE_PROVIDER_SITE_OTHER): Payer: Medicare Other | Admitting: Nurse Practitioner

## 2015-05-23 VITALS — BP 114/64 | HR 87 | Temp 97.1°F | Ht 64.0 in | Wt 76.0 lb

## 2015-05-23 DIAGNOSIS — R636 Underweight: Secondary | ICD-10-CM | POA: Diagnosis not present

## 2015-05-23 DIAGNOSIS — E46 Unspecified protein-calorie malnutrition: Secondary | ICD-10-CM | POA: Diagnosis not present

## 2015-05-23 NOTE — Patient Instructions (Signed)
High-Protein and High-Calorie Diet Eating high-protein and high-calorie foods can help you to gain weight, heal after an injury, and recover after an illness or surgery.  WHAT IS MY PLAN? The specific amount of daily protein and calories you need depends on:  Your body weight.  The reason this diet is recommended for you. Generally, a high-protein, high-calorie diet involves:   Eating 250-500 extra calories each day.  Making sure that 10-35% of your daily calories come from protein. Talk to your health care provider about how much protein and how many calories you need each day. Follow the diet as directed by your health care provider.  WHAT DO I NEED TO KNOW ABOUT THIS DIET?  Ask your health care provider if you should take a nutritional supplement.   Try to eat six small meals each day instead of three large meals.   Eat a balanced diet, including one food that is high in protein at each meal.  Keep nutritious snacks handy, such as nuts, trail mixes, dried fruit, and yogurt.   If you have kidney disease or diabetes, eating too much protein may put extra stress on your kidneys. Talk to your health care provider if you have either of those conditions. WHAT ARE SOME HIGH-PROTEIN FOODS? Grains Quinoa. Bulgur wheat. Vegetables Soybeans. Peas. Meats and Other Protein Sources Beef, pork, and poultry. Fish and seafood. Eggs. Tofu. Textured vegetable protein (TVP). Peanut butter. Nuts and seeds. Dried beans. Protein powders. Dairy Whole milk. Whole-milk yogurt. Powdered milk. Cheese. Cottage Cheese. Eggnog. Beverages High-protein supplement drinks. Soy milk. Other Protein bars. The items listed above may not be a complete list of recommended foods or beverages. Contact your dietitian for more options. WHAT ARE SOME HIGH-CALORIE FOODS? Grains Pasta. Quick breads. Muffins. Pancakes. Ready-to-eat cereal. Vegetables Vegetables cooked in oil or butter. Fried  potatoes. Fruits Dried fruit. Fruit leather. Canned fruit in syrup. Fruit juice. Avocados. Meats and Other Protein Sources Peanut butter. Nuts and seeds. Dairy Heavy cream. Whipped cream. Cream cheese. Sour cream. Ice cream. Custard. Pudding. Beverages Meal-replacement beverages. Nutrition shakes. Fruit juice. Sugar-sweetened soft drinks. Condiments Salad dressing. Mayonnaise. Alfredo sauce. Fruit preserves or jelly. Honey. Syrup. Sweets/Desserts Cake. Cookies. Pie. Pastries. Candy bars. Chocolate. Fats and Oils Butter or margarine. Oil. Gravy. Other Meal-replacement bars. The items listed above may not be a complete list of recommended foods or beverages. Contact your dietitian for more options. WHAT ARE SOME TIPS FOR INCLUDING HIGH-PROTEIN AND HIGH-CALORIE FOODS IN MY DIET?  Add whole milk, half-and-half, or heavy cream to cereal, pudding, soup, or hot cocoa.  Add whole milk to instant breakfast drinks.  Add peanut butter to oatmeal or smoothies.  Add powdered milk to baked goods, smoothies, or milkshakes.  Add powdered milk, cream, or butter to mashed potatoes.  Add cheese to cooked vegetables.  Make whole-milk yogurt parfaits. Top them with granola, fruit, or nuts.  Add cottage cheese to your fruit.  Add avocados, cheese, or both to sandwiches or salads.  Add meat, poultry, or seafood to rice, pasta, casseroles, salads, and soups.   Use mayonnaise when making egg salad, chicken salad, or tuna salad.  Use peanut butter as a topping for pretzels, celery, or crackers.  Add beans to casseroles, dips, and spreads.  Add pureed beans to sauces and soups.  Replace calorie-free drinks with calorie-containing drinks, such as milk and fruit juice.   This information is not intended to replace advice given to you by your health care provider. Make sure you discuss   any questions you have with your health care provider.   Document Released: 02/11/2005 Document  Revised: 03/04/2014 Document Reviewed: 07/27/2013 Elsevier Interactive Patient Education 2016 Elsevier Inc.  

## 2015-05-23 NOTE — Progress Notes (Signed)
   Subjective:    Patient ID: Roberta Brooks, female    DOB: [birth year], 50 y.o.   MRN: [id number]  HPI Patient comes in today for 2 week weight check- she is extremely under weight- has had a large weigh loss over the last several months- she was started on megace a month ago. Says that her appetite has increased and she eats 3 meals a day but still no change in weight. SHe has not had megace for a week because cannot afford to but until she gets paid. She is still not eating 3 times a day everyday.    Review of Systems  Constitutional: Negative.   HENT: Negative.   Respiratory: Negative.   Cardiovascular: Negative.   Genitourinary: Negative.   Neurological: Negative.   Psychiatric/Behavioral: Negative.   All other systems reviewed and are negative.      Objective:   Physical Exam  Constitutional: She is oriented to person, place, and time. She appears well-developed and well-nourished. No distress.  Patient is still very thin. Weight unchanged from last visit  Cardiovascular: Normal rate, regular rhythm and normal heart sounds.   Pulmonary/Chest: Effort normal and breath sounds normal.  Neurological: She is alert and oriented to person, place, and time. She has normal reflexes.  Skin: Skin is warm.  Psychiatric: She has a normal mood and affect. Her behavior is normal. Judgment and thought content normal.   BP 114/64 mmHg  Pulse 87  Temp(Src) 97.1 F (36.2 C) (Oral)  Ht 5\' 4"  (1.626 m)  Wt 76 lb (34.473 kg)  BMI 13.04 kg/m2        Assessment & Plan:   1. Underweight   2. Protein-calorie malnutrition ([place])    Get back on megace daily Eat 3 meals a day and 2 snacks Continue ensure Keep diary of food intake for 2 weeks RTO in 2 weeks recheck  [place], FNP

## 2015-05-28 ENCOUNTER — Other Ambulatory Visit: Payer: Self-pay | Admitting: Nurse Practitioner

## 2015-06-08 ENCOUNTER — Ambulatory Visit: Payer: Medicare Other | Admitting: Nurse Practitioner

## 2015-06-12 ENCOUNTER — Encounter: Payer: Self-pay | Admitting: Nurse Practitioner

## 2015-07-04 DIAGNOSIS — N3001 Acute cystitis with hematuria: Secondary | ICD-10-CM | POA: Diagnosis not present

## 2015-07-04 DIAGNOSIS — R350 Frequency of micturition: Secondary | ICD-10-CM | POA: Diagnosis not present

## 2015-08-01 DIAGNOSIS — R350 Frequency of micturition: Secondary | ICD-10-CM | POA: Diagnosis not present

## 2015-08-01 DIAGNOSIS — R300 Dysuria: Secondary | ICD-10-CM | POA: Diagnosis not present

## 2015-08-01 DIAGNOSIS — M545 Low back pain: Secondary | ICD-10-CM | POA: Diagnosis not present

## 2015-08-27 ENCOUNTER — Other Ambulatory Visit: Payer: Self-pay | Admitting: Nurse Practitioner

## 2015-08-28 NOTE — Telephone Encounter (Signed)
Last refill without being seen 

## 2015-08-28 NOTE — Telephone Encounter (Signed)
Patient's number disconnected.

## 2015-08-30 ENCOUNTER — Other Ambulatory Visit: Payer: Self-pay | Admitting: Nurse Practitioner

## 2015-08-30 NOTE — Telephone Encounter (Signed)
Please call in xanax with 0 refills Last refill without being seen  

## 2015-08-30 NOTE — Telephone Encounter (Signed)
If xanax approved please route to Pool B for nurse to call in.

## 2015-08-31 NOTE — Telephone Encounter (Signed)
RX called for [facility]ax [facility] Okayed per MMM Pt notified

## 2015-11-13 ENCOUNTER — Telehealth: Payer: Self-pay | Admitting: Nurse Practitioner

## 2015-11-13 NOTE — Telephone Encounter (Signed)
Pharmacy changed

## 2015-12-05 ENCOUNTER — Other Ambulatory Visit: Payer: Self-pay | Admitting: *Deleted

## 2015-12-07 ENCOUNTER — Encounter: Payer: Self-pay | Admitting: Family Medicine

## 2015-12-07 ENCOUNTER — Ambulatory Visit (INDEPENDENT_AMBULATORY_CARE_PROVIDER_SITE_OTHER): Payer: Medicare Other | Admitting: Family Medicine

## 2015-12-07 VITALS — BP 131/79 | HR 79 | Temp 97.3°F | Ht 64.0 in | Wt 74.8 lb

## 2015-12-07 DIAGNOSIS — Z720 Tobacco use: Secondary | ICD-10-CM | POA: Diagnosis not present

## 2015-12-07 DIAGNOSIS — R636 Underweight: Secondary | ICD-10-CM

## 2015-12-07 DIAGNOSIS — F329 Major depressive disorder, single episode, unspecified: Secondary | ICD-10-CM

## 2015-12-07 DIAGNOSIS — Z23 Encounter for immunization: Secondary | ICD-10-CM

## 2015-12-07 DIAGNOSIS — F32A Depression, unspecified: Secondary | ICD-10-CM

## 2015-12-07 MED ORDER — ESCITALOPRAM OXALATE 20 MG PO TABS: 20.0000 mg | ORAL_TABLET | Freq: Every day | ORAL | 1 refills | Status: DC

## 2015-12-07 MED ORDER — MIRTAZAPINE 15 MG PO TABS: 15.0000 mg | ORAL_TABLET | Freq: Every day | ORAL | 1 refills | Status: DC

## 2015-12-07 NOTE — Patient Instructions (Addendum)
Great to see you!  Continue eating 3 meals a day  Stop the "stay awake"  Continue lexapro, start remeron 1 pill every night  Come back in 1 month for follow up

## 2015-12-07 NOTE — Progress Notes (Signed)
HPI  Patient presents today here for depression and weight loss.  Patient endorses severe depressive symptoms, denies suicidal thoughts. She states that she has a history of Graves' disease, status post radioactive ablation with no other obvious reasons for her weight loss.  She states that she eats 2-3 meals daily as well as taking boost-like shake twice daily between meals  Has tried abilify without improvement  Out of lexapro for 3 days, cannot tel that I tis helping.   States that inability to gain weight is depressing and one of the sources of her issues.  Takes 2 "stay awake" caffeine pills daily  PMH: Smoking status noted ROS: Per HPI  Objective: BP 131/79   Pulse 79   Temp 97.3 F (36.3 C) (Oral)   Ht 5' 4" (1.626 m)   Wt 74 lb 12.8 oz (33.9 kg)   BMI 12.84 kg/m  Gen: NAD, alert, cooperative with exam, very thin- cachectic HEENT: NCAT CV: RRR, good S1/S2, no murmur Resp: CTABL, no wheezes, non-labored Ext: No edema, warm Neuro: Alert and oriented, No gross deficits  Psych- Denis SI, appropriate mood and affect  Assessment and plan:  # depression I believe that her primary problem is actually depression, likely her severe depression is causing decreased appetite and decreased eating. Stop stimulant pills Continue Lexapro,start Remeron.  # weight loss Patient is up-to-date on Pap smear, mammogram, and colonoscopy. Unclear reason for weight loss, last TSH was actually elevated echecking labs Consider low-dose CT given heavy tobacco use, however she does not qualify due to not being 50 years old. She does have greater than 30-pack-year history.  Smoking, tobacco abuse Not ready to quit,  encouraged to consider  Flu-shot given, counseling provided     Orders Placed This Encounter  Procedures  . Flu Vaccine QUAD 36+ mos IM  . TSH  . CMP14+EGFR  . CBC with Differential    Meds ordered this encounter  Medications  . escitalopram (LEXAPRO) 20 MG  tablet    Sig: Take 1 tablet (20 mg total) by mouth daily.    Dispense:  90 tablet    Refill:  1  . mirtazapine (REMERON) 15 MG tablet    Sig: Take 1 tablet (15 mg total) by mouth at bedtime.    Dispense:  30 tablet    Refill:  [place], MD [facility] 12/07/2015, 4:26 PM

## 2015-12-24 DIAGNOSIS — K047 Periapical abscess without sinus: Secondary | ICD-10-CM | POA: Diagnosis not present

## 2015-12-30 ENCOUNTER — Other Ambulatory Visit: Payer: Self-pay | Admitting: Nurse Practitioner

## 2016-01-08 ENCOUNTER — Telehealth: Payer: Self-pay | Admitting: Family Medicine

## 2016-01-08 NOTE — Telephone Encounter (Signed)
Pt notifed of recommendation Verbalizes understanding

## 2016-01-08 NOTE — Telephone Encounter (Signed)
apreciate FYI.   Pt is welcome to come in for eval if symptomatic from fall.   Laroy Apple, MD [facility] 01/08/2016, 12:12 PM

## 2016-01-15 ENCOUNTER — Encounter: Payer: Self-pay | Admitting: Nurse Practitioner

## 2016-01-15 ENCOUNTER — Ambulatory Visit (INDEPENDENT_AMBULATORY_CARE_PROVIDER_SITE_OTHER): Payer: Medicare Other | Admitting: Nurse Practitioner

## 2016-01-15 VITALS — BP 133/80 | HR 93 | Temp 98.1°F | Ht 64.0 in | Wt 78.8 lb

## 2016-01-15 DIAGNOSIS — L240 Irritant contact dermatitis due to detergents: Secondary | ICD-10-CM | POA: Diagnosis not present

## 2016-01-15 MED ORDER — METHYLPREDNISOLONE ACETATE 80 MG/ML IJ SUSP
40.0000 mg | Freq: Once | INTRAMUSCULAR | Status: AC
  Administered 2016-01-15: 40 mg via INTRAMUSCULAR

## 2016-01-15 MED ORDER — ALPRAZOLAM 0.5 MG PO TABS: ORAL_TABLET | ORAL | 1 refills | Status: DC

## 2016-01-15 MED ORDER — METHYLPREDNISOLONE ACETATE 40 MG/ML IJ SUSP: 40.0000 mg | Freq: Once | INTRAMUSCULAR | Status: DC

## 2016-01-15 NOTE — Progress Notes (Signed)
   Subjective:    Patient ID: Roberta Brooks, female    DOB: 1965-10-27, 50 y.o.   MRN: [id number]  HPI  Patient comes in c/o - she has developed a rash  On bil hands and wrist- left hand was swollen this  Morning , has gone down some. Originally started on left hand and now right hand is developing rash. Itches very badly. She started on mirtazapine 1 month ago and that is the only new things she has tried. She stopped it Friday because she thought that was causing the reaction. Has been using germx on hands before holding her grand baby.  Review of Systems  Constitutional: Negative.   HENT: Negative.   Respiratory: Negative.   Cardiovascular: Negative.   Genitourinary: Negative.   Neurological: Negative.   Psychiatric/Behavioral: Negative.   All other systems reviewed and are negative.      Objective:   Physical Exam  Constitutional: She is oriented to person, place, and time. She appears well-developed and well-nourished.  Cardiovascular: Normal rate, regular rhythm and normal heart sounds.   Pulmonary/Chest: Effort normal and breath sounds normal.  Neurological: She is alert and oriented to person, place, and time.  Skin: Skin is warm. Rash noted. Erythema: palms of hands are red and itchy.  Psychiatric: She has a normal mood and affect. Her behavior is normal. Judgment and thought content normal.   BP 133/80   Pulse 93   Temp 98.1 F (36.7 C) (Oral)   Ht 5\' 4"  (1.626 m)   Wt 78 lb 12.8 oz (35.7 kg)   BMI 13.53 kg/m      Assessment & Plan:   1. Irritant contact dermatitis due to detergent    Meds ordered this encounter  Medications  . methylPREDNISolone acetate (DEPO-MEDROL) injection 40 mg  . ALPRAZolam (XANAX) 0.5 MG tablet    Sig: TAKE 1 TABLET 3 TIMES A DAY AS NEEDED    Dispense:  90 tablet    Refill:  1    Not to exceed 4 additional fills before 10/14/2015    Order Specific Question:   Supervising Provider    Answer:   Eustaquio Maize [[id number]]   Avoid  scratching Cool compresses May take benadryl OTC Avoid germx  Mary-Margaret Hassell Done, FNP

## 2016-01-15 NOTE — Patient Instructions (Signed)
Contact Dermatitis Introduction Dermatitis is redness, soreness, and swelling (inflammation) of the skin. Contact dermatitis is a reaction to certain substances that touch the skin. You either touched something that irritated your skin, or you have allergies to something you touched. Follow these instructions at home: [place] your skin as needed.  Apply cool compresses to the affected areas.  Try taking a bath with:  Epsom salts. Follow the instructions on the package. You can get these at a pharmacy or grocery store.  Baking soda. Pour a small amount into the bath as told by your doctor.  Colloidal oatmeal. Follow the instructions on the package. You can get this at a pharmacy or grocery store.  Try applying baking soda paste to your skin. Stir water into baking soda until it looks like paste.  Do not scratch your skin.  Bathe less often.  Bathe in lukewarm water. Avoid using hot water. Medicines  Take or apply over-the-counter and prescription medicines only as told by your doctor.  If you were prescribed an antibiotic medicine, take or apply your antibiotic as told by your doctor. Do not stop taking the antibiotic even if your condition starts to get better. General instructions  Keep all follow-up visits as told by your doctor. This is important.  Avoid the substance that caused your reaction. If you do not know what caused it, keep a journal to try to track what caused it. Write down:  What you eat.  What cosmetic products you use.  What you drink.  What you wear in the affected area. This includes jewelry.  If you were given a bandage (dressing), take care of it as told by your doctor. This includes when to change and remove it. Contact a doctor if:  You do not get better with treatment.  Your condition gets worse.  You have signs of infection such as:  Swelling.  Tenderness.  Redness.  Soreness.  Warmth.  You have a fever.  You  have new symptoms. Get help right away if:  You have a very bad headache.  You have neck pain.  Your neck is stiff.  You throw up (vomit).  You feel very sleepy.  You see red streaks coming from the affected area.  Your bone or joint underneath the affected area becomes painful after the skin has healed.  The affected area turns darker.  You have trouble breathing. This information is not intended to replace advice given to you by your health care provider. Make sure you discuss any questions you have with your health care provider. Document Released: 12/09/2008 Document Revised: 07/20/2015 Document Reviewed: 06/29/2014  2017 Elsevier

## 2016-02-10 ENCOUNTER — Other Ambulatory Visit: Payer: Self-pay | Admitting: Family Medicine

## 2016-04-12 ENCOUNTER — Other Ambulatory Visit: Payer: Self-pay | Admitting: Nurse Practitioner

## 2016-05-02 ENCOUNTER — Encounter: Payer: Self-pay | Admitting: Nurse Practitioner

## 2016-05-02 ENCOUNTER — Ambulatory Visit (INDEPENDENT_AMBULATORY_CARE_PROVIDER_SITE_OTHER): Payer: Medicare Other | Admitting: Nurse Practitioner

## 2016-05-02 ENCOUNTER — Telehealth: Payer: Self-pay | Admitting: Nurse Practitioner

## 2016-05-02 VITALS — BP 122/69 | HR 76 | Temp 97.6°F | Ht 64.0 in | Wt 79.0 lb

## 2016-05-02 DIAGNOSIS — K047 Periapical abscess without sinus: Secondary | ICD-10-CM

## 2016-05-02 MED ORDER — HYDROCODONE-ACETAMINOPHEN 5-325 MG PO TABS: 1.0000 | ORAL_TABLET | Freq: Four times a day (QID) | ORAL | 0 refills | Status: DC | PRN

## 2016-05-02 MED ORDER — CLINDAMYCIN HCL 300 MG PO CAPS: 300.0000 mg | ORAL_CAPSULE | Freq: Four times a day (QID) | ORAL | 0 refills | Status: DC

## 2016-05-02 NOTE — Progress Notes (Signed)
   Subjective:    Patient ID: Roberta Brooks, female    DOB: [birth year], 51 y.o.   MRN: [id number]  HPI Patient comes in today c/o right jaw pain- started several days ago with tooth ache.    Review of Systems  Constitutional: Positive for appetite change (apinful to eat).  Respiratory: Negative.   Cardiovascular: Negative.   Gastrointestinal: Negative.   Genitourinary: Negative.   Neurological: Negative.   Psychiatric/Behavioral: Negative.   All other systems reviewed and are negative.      Objective:   Physical Exam  Constitutional: She is oriented to person, place, and time. She appears well-developed and well-nourished. No distress.  HENT:  Right jaw swollen with erythematous gum riht lower jaw.  Cardiovascular: Normal rate and regular rhythm.   Pulmonary/Chest: Effort normal and breath sounds normal.  Neurological: She is alert and oriented to person, place, and time.  Skin: Skin is warm.  Psychiatric: She has a normal mood and affect. Her behavior is normal. Judgment and thought content normal.    BP 122/69   Pulse 76   Temp 97.6 F (36.4 C) (Oral)   Ht 5\' 4"  (1.626 m)   Wt 79 lb (35.8 kg)   BMI 13.56 kg/m       Assessment & Plan:  1. Tooth abscess Gargle with salt water BID Ice to right jaw Needs to see dentist RTO prn - clindamycin (CLEOCIN) 300 MG capsule; Take 1 capsule (300 mg total) by mouth 4 (four) times daily.  Dispense: 40 capsule; Refill: 0 - HYDROcodone-acetaminophen (LORTAB) 5-325 MG tablet; Take 1 tablet by mouth every 6 (six) hours as needed for moderate pain.  Dispense: 40 tablet; Refill: 0   Mary-Margaret Hassell Done, FNP

## 2016-05-02 NOTE — Telephone Encounter (Signed)
Patient aware she needs to be seen and appointment scheduled.

## 2016-05-02 NOTE — Patient Instructions (Signed)
Dental Abscess A dental abscess is pus in or around a tooth. Follow these instructions at home:  Take medicines only as told by your dentist.  If you were prescribed antibiotic medicine, finish all of it even if you start to feel better.  Rinse your mouth (gargle) often with salt water.  Do not drive or use heavy machinery, like a lawn mower, while taking pain medicine.  Do not apply heat to the outside of your mouth.  Keep all follow-up visits as told by your dentist. This is important. Contact a doctor if:  Your pain is worse, and medicine does not help. Get help right away if:  You have a fever or chills.  Your symptoms suddenly get worse.  You have a very bad headache.  You have problems breathing or swallowing.  You have trouble opening your mouth.  You have puffiness (swelling) in your neck or around your eye. This information is not intended to replace advice given to you by your health care provider. Make sure you discuss any questions you have with your health care provider. Document Released: 06/28/2014 Document Revised: 07/20/2015 Document Reviewed: 02/08/2014 Elsevier Interactive Patient Education  2017 Elsevier Inc.  

## 2016-05-26 ENCOUNTER — Other Ambulatory Visit: Payer: Self-pay | Admitting: Nurse Practitioner

## 2016-05-28 ENCOUNTER — Other Ambulatory Visit: Payer: Self-pay | Admitting: Family Medicine

## 2016-05-28 NOTE — Telephone Encounter (Signed)
Please call in xanax with 1 refills 

## 2016-05-28 NOTE — Telephone Encounter (Signed)
Refill called to [facility]

## 2016-05-30 ENCOUNTER — Encounter: Payer: Self-pay | Admitting: Family Medicine

## 2016-05-30 ENCOUNTER — Ambulatory Visit (INDEPENDENT_AMBULATORY_CARE_PROVIDER_SITE_OTHER): Payer: Medicare HMO | Admitting: Family Medicine

## 2016-05-30 VITALS — BP 139/75 | HR 69 | Temp 97.6°F | Ht 64.0 in | Wt 79.8 lb

## 2016-05-30 DIAGNOSIS — K047 Periapical abscess without sinus: Secondary | ICD-10-CM | POA: Diagnosis not present

## 2016-05-30 DIAGNOSIS — R636 Underweight: Secondary | ICD-10-CM

## 2016-05-30 MED ORDER — CLARITHROMYCIN 500 MG PO TABS: 500.0000 mg | ORAL_TABLET | Freq: Two times a day (BID) | ORAL | 0 refills | Status: DC

## 2016-05-30 NOTE — Addendum Note (Signed)
Addended by: Timmothy Euler on: 05/30/2016 10:13 AM   Modules accepted: Orders

## 2016-05-30 NOTE — Patient Instructions (Signed)
Great to see you again!  We are working on a CT  We will work on a referral to nutrition for you as well.

## 2016-05-30 NOTE — Progress Notes (Signed)
   HPI  Patient presents today here with tooth pain, facial abscess, and severe underweight.  Patient states she was seen last month for this issue, she had severe swelling of the face was started on clindamycin given some hydrocodone. She states that she could not tolerate clindamycin so she still has a few pills left. Caused burning of the throat.  She took hydrocodone this morning to deal with pain.  Patient has had unexplained weight loss, she states that she "eats when she wants to". She has a family with her that states that she does eat, however she does get many meals. She is willing to get a dietitian.  PMH: Smoking status noted ROS: Per HPI  Objective: BP 139/75   Pulse 69   Temp 97.6 F (36.4 C) (Oral)   Ht 5\' 4"  (1.626 m)   Wt 79 lb 12.8 oz (36.2 kg)   BMI 13.70 kg/m  Gen: NAD, alert, cooperative with exam HEENT: NCAT, tenderness over the molars of the right lower side CV: RRR, good S1/S2, no murmur Resp: CTABL, no wheezes, non-labored Ext: No edema, warm Neuro: Alert and oriented, No gross deficits Skin Apparent abscess at the right lower mandible border with tenderness of the surrounding bony structures  Assessment and plan:  # Dental abscess Patient has dental tenderness and abscess apparent on the mandible underneath the affected teeth. She has tenderness of the bone as well as tenderness in the deep tissues around. She's been inadequately treated with clindamycin due to poor compliance Change to Biaxin for better tolerance CT maxillofacial stat  # Underweight I'm very concerned about anorexia Refer to medical nutrition therapy Discussed with patient, she is willing to go.       Orders Placed This Encounter  Procedures  . CT Maxillofacial WO CM    Standing Status:   Future    Standing Expiration Date:   08/29/2017    Order Specific Question:   Reason for Exam (SYMPTOM  OR DIAGNOSIS REQUIRED)    Answer:   Concern for deep tissue abscess with poor  compliance after dental abscess    Order Specific Question:   Is patient pregnant?    Answer:   No    Order Specific Question:   Preferred imaging location?    Answer:   [facility]    Order Specific Question:   Radiology Contrast Protocol - do NOT remove file path    Answer:   [email or web address]  . Amb ref to Medical Nutrition Therapy-MNT    Referral Priority:   Routine    Referral Type:   Consultation    Referral Reason:   Specialty Services Required    Requested Specialty:   Nutrition    Number of Visits Requested:   1    Meds ordered this encounter  Medications  . clarithromycin (BIAXIN) 500 MG tablet    Sig: Take 1 tablet (500 mg total) by mouth 2 (two) times daily.    Dispense:  20 tablet    Refill:  0    Laroy Apple, MD [facility] 05/30/2016, 9:45 AM

## 2016-05-31 ENCOUNTER — Other Ambulatory Visit: Payer: Self-pay | Admitting: Family Medicine

## 2016-05-31 ENCOUNTER — Telehealth: Payer: Self-pay | Admitting: Family Medicine

## 2016-05-31 LAB — CMP14+EGFR
A/G RATIO: 2 (ref 1.2–2.2)
ALBUMIN: 4.6 g/dL (ref 3.5–5.5)
ALT: 12 IU/L (ref 0–32)
AST: 19 IU/L (ref 0–40)
Alkaline Phosphatase: 62 IU/L (ref 39–117)
BUN / CREAT RATIO: 19 (ref 9–23)
BUN: 18 mg/dL (ref 6–24)
Bilirubin Total: 0.5 mg/dL (ref 0.0–1.2)
CALCIUM: 9.4 mg/dL (ref 8.7–10.2)
CO2: 24 mmol/L (ref 18–29)
CREATININE: 0.96 mg/dL (ref 0.57–1.00)
Chloride: 101 mmol/L (ref 96–106)
GFR calc Af Amer: 79 mL/min/{1.73_m2} (ref >59)
GFR, EST NON AFRICAN AMERICAN: 69 mL/min/{1.73_m2} (ref >59)
GLOBULIN, TOTAL: 2.3 g/dL (ref 1.5–4.5)
Glucose: 83 mg/dL (ref 65–99)
POTASSIUM: 3.9 mmol/L (ref 3.5–5.2)
SODIUM: 140 mmol/L (ref 134–144)
Total Protein: 6.9 g/dL (ref 6.0–8.5)

## 2016-05-31 LAB — CBC WITH DIFFERENTIAL/PLATELET
BASOS: 1 %
Basophils Absolute: 0 10*3/uL (ref 0.0–0.2)
EOS (ABSOLUTE): 0.1 10*3/uL (ref 0.0–0.4)
EOS: 1 %
HEMATOCRIT: 40.5 % (ref 34.0–46.6)
Hemoglobin: 13.8 g/dL (ref 11.1–15.9)
IMMATURE GRANULOCYTES: 0 %
Immature Grans (Abs): 0 10*3/uL (ref 0.0–0.1)
LYMPHS ABS: 1.3 10*3/uL (ref 0.7–3.1)
Lymphs: 21 %
MCH: 32.4 pg (ref 26.6–33.0)
MCHC: 34.1 g/dL (ref 31.5–35.7)
MCV: 95 fL (ref 79–97)
MONOS ABS: 0.5 10*3/uL (ref 0.1–0.9)
Monocytes: 8 %
Neutrophils Absolute: 4.2 10*3/uL (ref 1.4–7.0)
Neutrophils: 69 %
Platelets: 140 10*3/uL — ABNORMAL LOW (ref 150–379)
RBC: 4.26 x10E6/uL (ref 3.77–5.28)
RDW: 12.3 % (ref 12.3–15.4)
WBC: 6.1 10*3/uL (ref 3.4–10.8)

## 2016-05-31 LAB — TSH: TSH: 0.232 u[IU]/mL — ABNORMAL LOW (ref 0.450–4.500)

## 2016-05-31 MED ORDER — LEVOTHYROXINE SODIUM 50 MCG PO TABS: 50.0000 ug | ORAL_TABLET | Freq: Every day | ORAL | 1 refills | Status: DC

## 2016-05-31 NOTE — Telephone Encounter (Signed)
Good this is likely only an abcess, I would recommend taking biaxin.  Will calncel CT  Please follow up in 1 week if not completely improved or does not continue to steadily improve from here.   Laroy Apple, MD [facility] 05/31/2016, 11:46 AM

## 2016-05-31 NOTE — Telephone Encounter (Signed)
Patient aware and verbalizes understanding. 

## 2016-06-05 ENCOUNTER — Ambulatory Visit (HOSPITAL_COMMUNITY): Admission: RE | Admit: 2016-06-05 | Payer: Medicare HMO | Source: Ambulatory Visit

## 2016-06-05 ENCOUNTER — Ambulatory Visit (HOSPITAL_COMMUNITY)
Admission: RE | Admit: 2016-06-05 | Discharge: 2016-06-05 | Disposition: A | Discharge status: Home / Self Care | Payer: Medicare HMO | Source: Ambulatory Visit | Attending: Family Medicine | Admitting: Family Medicine

## 2016-06-05 DIAGNOSIS — K047 Periapical abscess without sinus: Secondary | ICD-10-CM | POA: Diagnosis not present

## 2016-06-12 ENCOUNTER — Encounter: Payer: Medicare HMO | Attending: Family Medicine | Admitting: Nutrition

## 2016-06-12 VITALS — Ht 63.0 in | Wt 77.4 lb

## 2016-06-12 DIAGNOSIS — Z681 Body mass index (BMI) 19 or less, adult: Secondary | ICD-10-CM | POA: Insufficient documentation

## 2016-06-12 DIAGNOSIS — Z713 Dietary counseling and surveillance: Secondary | ICD-10-CM | POA: Diagnosis not present

## 2016-06-12 DIAGNOSIS — E43 Unspecified severe protein-calorie malnutrition: Secondary | ICD-10-CM

## 2016-06-12 DIAGNOSIS — R636 Underweight: Secondary | ICD-10-CM | POA: Diagnosis not present

## 2016-06-12 DIAGNOSIS — R634 Abnormal weight loss: Secondary | ICD-10-CM

## 2016-06-12 NOTE — Patient Instructions (Addendum)
Goals 1. Breafast: eggs, frosted mini wheats using whole milk 2. Drink 3-4 glasses of water based liquid  You need more water for hydration and dry skin. 3. Eat three meals and 2-3 snacks per day   Snack ideas: fruit, nabs, jello, chocolate milk, ice cream 4. Try Sweet tea, lemonade or chocolate milk with meals instead of soda 5. Eat 1-2 vegetables with dinner every night 6. Only eat candy after snack or finished meals. Gain 1-2 lbs in next 2 weeks. Keep a food journal and bring at next visit.

## 2016-06-12 NOTE — Progress Notes (Signed)
Medical Nutrition Therapy:  Appt start time: 2130 end time:  1630   Assessment: Here for Underweight, malnutrition and weight loss. Has lost 30 lbs in the last year or so.LIves with her brother, mother and uncle. Wants to gain weight.  Has carious teeth on top and has carious teeth on bottom. Had a tooth abscess on left inside of her jaw and is on antibiotics for it right now. Had a abcessed tooth. Has history of depression but on Lexapro and Remeron. It's helping some she reports. Says has felt better Recently. Has issues with anxiety and fears going to dentist to get the tooth pulled. Doesn't like needles or shots.     Has been trying to drink high calorie supplements from [facility] when she can afford them. this am.  Admits to being a picky eater. Likes candy. Doesn't get hungry a lot. She prepares her own breakfast and lunch and her uncle prepares dinner.  She does take a MVI daily.   She is cachetic and very thin. Brittle, dry hair, sunken in eyes, dark circles under eyes, veins in arms are seen thru the skin. Dry pale skin. Bitemporal wasting. Bony prominent's. No body fat.    She wants to eat but didn't know what to eat to gain weight. Willing to improve her food choices and increase calories to improve her overall nutrition and health. Finances are limited and access to healthier foods are limited. However, she is willing to try to improve her eating habits to gain weight.     Occasional constipation. Chewing problems some due to carious and missing teeth. Wants to get dentures but can't afford them.  Needs to get her tooth pulled but her anxiety is her barrier.       She reports being very tired, fatigued  and no energy even when she isn't doing anything. Doesn't have energy to do much other than watch tv.   Doesn't sleep well at night and takes her xanax sometimes to help her sleep at night. Only takes 1 pill a day if she takes it.    Severely underweight with BMI of 13 with protein calorie  malnutrition and muscle wasting. She appears dehydrated.   Preferred Learning Style:   No preference indicated   Learning Readiness:   Ready  Change in progress   MEDICATIONS:    DIETARY INTAKE:    24-hr recall:  B ( AM): Equite Plus or  Fruit loops with whole milk,   Snk ( AM): none or candy L ( PM): Black forest ham on white bread, mayo or mustard, Sprite Snk ( PM): candy D ( PM): Spaghetti with elbow noodles.-hamburger, noodles,spaghetti sauce 1-2 cups, Sprite Snk ( PM):  Beverages:   Usual physical activity: None  Estimated energy needs: 1800 calories 200 g carbohydrates 135 g protein 50 g fat  Progress Towards Goal(s):  In progress.   Nutritional Diagnosis:  NI-1.4 Inadequate energy intake As related to insuffient calorie intake.  As evidenced by being underweight with a BMI of 13.    Intervention:  High Calorie High Protein Diet, small frequent meals, My Plate, healthy snacks, ways to add calories with protein and healthy fats and ways to gain weight.  Goals 1. Breafast: eggs, frosted mini wheats using whole milk 2. Drink 3-4 glasses of water based liquid daily.  You need more water for hydration and dry skin. 3. Eat three meals and 2-3 snacks per day   Snack ideas: fruit, nabs, jello, chocolate milk, ice cream  4. Try Sweet tea, lemonade or chocolate milk with meals instead of soda 5. Eat 1-2 vegetables with dinner every night 6. Only eat candy after snack or finished meals. Gain 1-2 lbs in next 2 weeks. Keep a food journal and bring with you next visit.  Teaching Method Utilized:  Visual Auditory Hands on  Handouts given during visit include:  The Plate Method  High Calorie High Protein Diet  High Calorie High Protein Recipes  Barriers to learning/adherence to lifestyle change: limited reading ability Demonstrated degree of understanding via:  Teach Back   Monitoring/Evaluation:  Dietary intake, exercise, , and body weight in 2 week(s).  Would recommend to check B12 level and a prealbumin to better evaluate malnutrition status. She would benefit from a [facility] referral to address her depression issues.

## 2016-07-03 ENCOUNTER — Ambulatory Visit: Payer: Self-pay | Admitting: Nutrition

## 2016-07-17 ENCOUNTER — Other Ambulatory Visit: Payer: Self-pay | Admitting: Nurse Practitioner

## 2016-07-17 ENCOUNTER — Ambulatory Visit: Payer: Self-pay | Admitting: Nutrition

## 2016-07-18 HISTORY — PX: DENTAL SURGERY: SHX609

## 2016-07-24 ENCOUNTER — Encounter: Payer: Medicare HMO | Attending: Family Medicine | Admitting: Nutrition

## 2016-07-24 VITALS — Ht 63.0 in | Wt 80.2 lb

## 2016-07-24 DIAGNOSIS — R636 Underweight: Secondary | ICD-10-CM | POA: Insufficient documentation

## 2016-07-24 DIAGNOSIS — Z681 Body mass index (BMI) 19 or less, adult: Secondary | ICD-10-CM | POA: Insufficient documentation

## 2016-07-24 DIAGNOSIS — Z713 Dietary counseling and surveillance: Secondary | ICD-10-CM | POA: Insufficient documentation

## 2016-07-24 DIAGNOSIS — E43 Unspecified severe protein-calorie malnutrition: Secondary | ICD-10-CM
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
## 2016-07-24 NOTE — Patient Instructions (Addendum)
Goals 1. Eat 5-6 small meals per day 2.  Increase fresh fruit and soft vegetables. 3. Carnation Instant Breakfast 4. Try soft foods; oatmeal, mashed potatoes, eggs/cheese  Baked beans,  Gain 2-3 lbs per month Avoid junk food May have Milkshakes

## 2016-07-26 ENCOUNTER — Ambulatory Visit (INDEPENDENT_AMBULATORY_CARE_PROVIDER_SITE_OTHER): Payer: Medicare HMO | Admitting: *Deleted

## 2016-07-26 VITALS — BP 139/75 | HR 66 | Ht 64.0 in | Wt 81.0 lb

## 2016-07-26 DIAGNOSIS — Z0000 Encounter for general adult medical examination without abnormal findings: Secondary | ICD-10-CM

## 2016-07-26 NOTE — Patient Instructions (Addendum)
  Roberta Brooks ,  Thank you for taking time to come for your Medicare Wellness Visit. I appreciate your ongoing commitment to your health goals. Please review the following plan we discussed and let me know if I can assist you in the future.   These are the goals we discussed: Goals    . Exercise 3x per week (30 min per time)          Walk for 30 minutes daily    . Plan meals          Continue to follow the dietician's recommendation to eat six small meals a day.      Keep follow up with your dietician and Dr Wendi Snipes. Mammogram scheduled for 10/04/16 at 2:30 at the mobile unit here at our office. Eye exam scheduled at [facility] in [place] for 08/15/16 at 2:00.   This is a list of the screening recommended for you and due dates:  Health Maintenance  Topic Date Due  . Tetanus Vaccine  06/19/1983  . Mammogram  12/26/2015  . Flu Shot  09/25/2016  . Pap Smear  11/14/2017  . Colon Cancer Screening  01/25/2025  . HIV Screening  Completed   Chong Sicilian, RN [phone]

## 2016-07-26 NOTE — Progress Notes (Signed)
Subjective:   Roberta Brooks is a 51 y.o. female who presents for an Initial Medicare Annual Wellness Visit. Roberta Brooks is on disability and lives in a trailer with her mother, brother, and uncle. They all receive social security checks and finances are a problem. They were receiving $150 a month in food stamps but recently that was reversed and as a group they received a $1000 bill from the county for the food stamps that they had received. She isn't sure why and doesn't know who to speak with. She has a case worker, Otilio Jefferson, who manages her medicaid but she hasn't spoken to her about the issue with food stamps. Roberta Brooks has one son, one daughter, 4 grandchildren and one step grand daughter. She is divorced, doesn't have any hobbies, and isn't involved with any church or community groups.   Review of Systems    Patient reports that her health is better than last year.  Psych: She is being treated for depression with Lexapro and Remeron and stated that since adding the Remeron she is much better.    Cardiac Risk Factors include: sedentary lifestyle;smoking/ tobacco exposure   HEENT: Developing difficulty seeing at a distance. No recent eye exam. She has had recent dental surgery for an abscessed bottom right tooth and had several upper teeth removed as well. The abscess is still being treated with Augmentin and she is being followed by an oral surgeon in [place].  Endocrine: TSH was low at last visit in April so her thyroid replacement dose was decreased to 47mcg. She will have this rechecked at her next visit in July.  Underweight with low BMI. She is being seen by a registered dietician and has a follow up scheduled.   Other systems negative    Objective:    Today's Vitals   07/26/16 0847  BP: 139/75  Pulse: 66  Weight: 81 lb (36.7 kg)  Height: 5\' 4"  (1.626 m)   Body mass index is 13.9 kg/m.   Current Medications (verified) Outpatient Encounter Prescriptions as of  07/26/2016  Medication Sig  . ALPRAZolam (XANAX) 0.5 MG tablet TAKE 1 TABLET BY MOUTH 3 TIMES A DAY AS NEEDED  . amoxicillin-clavulanate (AUGMENTIN) 875-125 MG tablet TAKE 1 TABLET TWICE DAILY UNTIL GONE  . chlorhexidine (PERIDEX) 0.12 % solution SWISH WITH 1/2 OZ. FOR 30 SECONDS THEN SPIT TWICE DAILY  . clarithromycin (BIAXIN) 500 MG tablet Take 1 tablet (500 mg total) by mouth 2 (two) times daily.  Marland Kitchen ENSURE (ENSURE) Take 237 mLs by mouth every morning.  . escitalopram (LEXAPRO) 20 MG tablet TAKE 1 TABLET (20 MG TOTAL) BY MOUTH DAILY.  Marland Kitchen HYDROcodone-acetaminophen (LORTAB) 5-325 MG tablet Take 1 tablet by mouth every 6 (six) hours as needed for moderate pain.  Marland Kitchen levothyroxine (SYNTHROID, LEVOTHROID) 50 MCG tablet Take 1 tablet (50 mcg total) by mouth daily.  . mirtazapine (REMERON) 15 MG tablet TAKE 1 TABLET (15 MG TOTAL) BY MOUTH AT BEDTIME.   No facility-administered encounter medications on file as of 07/26/2016.     Allergies (verified) Penicillins   History: Past Medical History:  Diagnosis Date  . Allergy   . Anxiety   . Depression   . Diverticulosis of colon   . Eye muscle weakness   . Grave's disease   . Unspecified hypothyroidism 09/16/2012   Past Surgical History:  Procedure Laterality Date  . CERVIX LESION DESTRUCTION    . COLONOSCOPY    . DENTAL SURGERY  07/18/2016  . ELBOW  SURGERY    . EYE MUSCLE SURGERY    . HAND SURGERY    . TONSILLECTOMY AND ADENOIDECTOMY     Family History  Problem Relation Age of Onset  . Heart disease Mother   . Graves' disease Father   . Healthy Daughter   . Healthy Son   . Graves' disease Paternal Grandmother   . Clotting disorder Brother   . Heart disease Brother   . Colon cancer Neg Hx   . Colon polyps Neg Hx   . Esophageal cancer Neg Hx   . Rectal cancer Neg Hx   . Stomach cancer Neg Hx    Social History   Occupational History  . Not on file.   Social History Main Topics  . Smoking status: Current Every Day Smoker     Packs/day: 0.50    Types: Cigarettes  . Smokeless tobacco: Never Used  . Alcohol use No  . Drug use: No  . Sexual activity: No    Tobacco Counseling Ready to quit: No Counseling given: Yes   Activities of Daily Living In your present state of health, do you have any difficulty performing the following activities: 07/26/2016  Hearing? N  Vision? Y  Difficulty concentrating or making decisions? Y  Walking or climbing stairs? N  Dressing or bathing? N  Doing errands, shopping? N  Preparing Food and eating ? N  Using the Toilet? N  In the past six months, have you accidently leaked urine? N  Do you have problems with loss of bowel control? N  Managing your Medications? N  Managing your Finances? N  Housekeeping or managing your Housekeeping? N  Some recent data might be hidden    Immunizations and Health Maintenance Immunization History  Administered Date(s) Administered  . Influenza,inj,Quad PF,36+ Mos 01/20/2013, 12/26/2014, 12/07/2015   Health Maintenance Due  Topic Date Due  . TETANUS/TDAP  06/19/1983  . MAMMOGRAM  12/26/2015    Patient Care Team: Chevis Pretty, FNP as PCP - General (Nurse Practitioner) Arlana Lindau, RD as Dietitian (Nutrition)  Debbora Presto, MD as Consulting Physician (Oral, maxillofacial surgeon)  Oral surgery on 07/18/16 by Dr Debbora Presto. No other hospitalizations, ER visits, or surgeries in the past year.     Assessment:   This is a routine wellness examination for Roberta Brooks.   Hearing/Vision screen No deficits noted during visit today. Eye exam scheduled.  Dietary issues and exercise activities discussed: Current Exercise Habits: The patient does not participate in regular exercise at present, Exercise limited by: Other - see comments (underweight and concerned that she will lose more weight)  Goals    . Exercise 3x per week (30 min per time)          Walk for 30 minutes daily    . Plan meals          Continue to follow the  dietician's recommendation to eat six small meals a day.      Patient has been asked to eat 6 small meals a day by her dietician. She was able to do that until her oral surgery on 07/18/16. She's having difficulty eating now but does drink Ensure. Availability of food is also an issue.   Depression Screen PHQ 2/9 Scores 07/26/2016 06/13/2016 05/30/2016 05/02/2016 01/15/2016 01/15/2016 12/07/2015  PHQ - 2 Score 0 4 0 0 0 6 6  PHQ- 9 Score - 13 - - 0 15 18  Reports that her medication is working well.   Fall Risk Fall  Risk  07/26/2016 06/13/2016 05/30/2016 05/02/2016 01/15/2016  Falls in the past year? No No No No No    MMSE - Mini Mental State Exam 07/26/2016 07/26/2016  Orientation to time 5 5  Orientation to Place 5 5  Registration 3 3  Attention/ Calculation 5 4  Recall 0 0  Language- name 2 objects 2 2  Language- repeat 1 1  Language- follow 3 step command 3 3  Language- read & follow direction 1 1  Write a sentence 1 1  Copy design 1 1  Total score 27 26  Unable to recall 3 objects, otherwise normal   Screening Tests Health Maintenance  Topic Date Due  . TETANUS/TDAP  06/19/1983  . MAMMOGRAM  12/26/2015  . INFLUENZA VACCINE  09/25/2016  . PAP SMEAR  11/14/2017  . COLONOSCOPY  01/25/2025  . HIV Screening  Completed      Plan:  -I scheduled an eye exam at [facility] in [place] for 08/15/16 at 2:00 -Mammogram scheduled for 10/04/16 at 2:30 -Follow up with Dr Wendi Snipes scheduled for 09/12/16 at 1:55 -Check on the cost of Tdap at the next visit -Walk for 30 minutes daily -Eat 6 small meals a day as prescribed by dietician. Keep f/u with dietician. -Check with [facility] in [place] about their food pantry and meal program. -I will talk with her social worker about available resources and balance owed on food stamps. I will follow up with her next week about this. Provided my phone number for her to reach out if necessary.   I have personally reviewed and noted the following in the  patient's chart:   . Medical and social history . Use of alcohol, tobacco or illicit drugs  . Current medications and supplements . Functional ability and status . Nutritional status . Physical activity . Advanced directives . List of other physicians . Hospitalizations, surgeries, and ER visits in previous 12 months . Vitals . Screenings to include cognitive, depression, and falls . Referrals and appointments  In addition, I have reviewed and discussed with patient certain preventive protocols, quality metrics, and best practice recommendations. A written personalized care plan for preventive services as well as general preventive health recommendations were provided to patient.     Chong Sicilian, RN   07/26/2016   I have reviewed and agree with the above AWV documentation.   Mary-Margaret Hassell Done, FNP

## 2016-08-15 DIAGNOSIS — H5200 Hypermetropia, unspecified eye: Secondary | ICD-10-CM | POA: Diagnosis not present

## 2016-08-22 ENCOUNTER — Ambulatory Visit: Payer: Self-pay | Admitting: Nutrition

## 2016-09-09 ENCOUNTER — Ambulatory Visit: Payer: Self-pay | Admitting: Nutrition

## 2016-09-12 ENCOUNTER — Ambulatory Visit: Payer: Medicare HMO | Admitting: Family Medicine

## 2016-09-19 ENCOUNTER — Ambulatory Visit: Payer: Self-pay | Admitting: Nutrition

## 2016-09-20 ENCOUNTER — Telehealth: Payer: Self-pay

## 2016-09-20 NOTE — Telephone Encounter (Signed)
Attempted to contact patient - NVM 

## 2016-09-20 NOTE — Progress Notes (Signed)
Attempted to contact patient - NVM - FYI

## 2016-09-20 NOTE — Telephone Encounter (Signed)
-----   Message from Chevis Pretty, [place] sent at 09/19/2016  6:46 PM EDT ----- Please contact patient and find out why she did not show for her nutrition counseling

## 2016-10-12 ENCOUNTER — Other Ambulatory Visit: Payer: Self-pay | Admitting: Nurse Practitioner

## 2016-10-16 ENCOUNTER — Other Ambulatory Visit: Payer: Self-pay | Admitting: Nurse Practitioner

## 2016-10-17 NOTE — Telephone Encounter (Signed)
Please call in alprazolam with 0 refills Last refill without being seen  

## 2016-10-18 NOTE — Telephone Encounter (Signed)
[facility] [place]

## 2016-11-19 ENCOUNTER — Other Ambulatory Visit: Payer: Self-pay | Admitting: Nurse Practitioner

## 2016-11-19 ENCOUNTER — Other Ambulatory Visit: Payer: Self-pay | Admitting: Family Medicine

## 2016-11-20 NOTE — Telephone Encounter (Signed)
forward to [place], MD [facility] 11/20/2016, 1:31 PM

## 2016-11-20 NOTE — Telephone Encounter (Signed)
Forward to PCP  Laroy Apple, MD [facility] 11/20/2016, 1:32 PM

## 2016-11-21 NOTE — Telephone Encounter (Signed)
Please call in alprazolam with 1 refills 

## 2016-11-21 NOTE — Telephone Encounter (Signed)
Rx called to pharmacy

## 2017-01-10 ENCOUNTER — Other Ambulatory Visit: Payer: Self-pay | Admitting: Nurse Practitioner

## 2017-02-10 ENCOUNTER — Other Ambulatory Visit: Payer: Self-pay | Admitting: Nurse Practitioner

## 2017-02-10 NOTE — Telephone Encounter (Signed)
Last refill without being seen 

## 2017-02-10 NOTE — Telephone Encounter (Signed)
Constant busy signal at patient's phone number.

## 2017-02-10 NOTE — Telephone Encounter (Signed)
Last seen 05/30/16  MMM

## 2017-03-14 ENCOUNTER — Other Ambulatory Visit: Payer: Self-pay | Admitting: Nurse Practitioner

## 2017-03-14 NOTE — Telephone Encounter (Signed)
Last seen 05/30/16  Dr Wendi Snipes

## 2017-04-13 ENCOUNTER — Other Ambulatory Visit: Payer: Self-pay | Admitting: Family Medicine

## 2017-04-14 NOTE — Telephone Encounter (Signed)
Last seen 05/30/16  MMM

## 2017-05-13 ENCOUNTER — Other Ambulatory Visit: Payer: Self-pay | Admitting: Nurse Practitioner

## 2017-05-15 ENCOUNTER — Other Ambulatory Visit: Payer: Self-pay | Admitting: Nurse Practitioner

## 2017-05-29 ENCOUNTER — Ambulatory Visit: Payer: Medicare HMO | Admitting: Family Medicine

## 2017-06-02 ENCOUNTER — Encounter: Payer: Self-pay | Admitting: Family Medicine

## 2017-06-02 ENCOUNTER — Ambulatory Visit (INDEPENDENT_AMBULATORY_CARE_PROVIDER_SITE_OTHER): Payer: Medicare HMO | Admitting: Family Medicine

## 2017-06-02 VITALS — BP 129/75 | HR 85 | Temp 98.9°F | Ht 64.0 in | Wt 78.2 lb

## 2017-06-02 DIAGNOSIS — F419 Anxiety disorder, unspecified: Secondary | ICD-10-CM | POA: Diagnosis not present

## 2017-06-02 DIAGNOSIS — F329 Major depressive disorder, single episode, unspecified: Secondary | ICD-10-CM | POA: Diagnosis not present

## 2017-06-02 DIAGNOSIS — F32A Depression, unspecified: Secondary | ICD-10-CM | POA: Insufficient documentation

## 2017-06-02 DIAGNOSIS — E039 Hypothyroidism, unspecified: Secondary | ICD-10-CM | POA: Diagnosis not present

## 2017-06-02 DIAGNOSIS — R636 Underweight: Secondary | ICD-10-CM | POA: Diagnosis not present

## 2017-06-02 DIAGNOSIS — R69 Illness, unspecified: Secondary | ICD-10-CM | POA: Diagnosis not present

## 2017-06-02 DIAGNOSIS — Z1231 Encounter for screening mammogram for malignant neoplasm of breast: Secondary | ICD-10-CM

## 2017-06-02 DIAGNOSIS — Z1239 Encounter for other screening for malignant neoplasm of breast: Secondary | ICD-10-CM

## 2017-06-02 LAB — MICROSCOPIC EXAMINATION: Renal Epithel, UA: NONE SEEN /hpf

## 2017-06-02 LAB — URINALYSIS, COMPLETE
Bilirubin, UA: NEGATIVE
Glucose, UA: NEGATIVE
KETONES UA: NEGATIVE
Leukocytes, UA: NEGATIVE
NITRITE UA: NEGATIVE
PH UA: 6.5 (ref 5.0–7.5)
SPEC GRAV UA: 1.02 (ref 1.005–1.030)
UUROB: 1 mg/dL (ref 0.2–1.0)

## 2017-06-02 MED ORDER — ALPRAZOLAM 0.5 MG PO TABS: 0.5000 mg | ORAL_TABLET | Freq: Three times a day (TID) | ORAL | 1 refills | Status: DC | PRN

## 2017-06-02 MED ORDER — MIRTAZAPINE 15 MG PO TABS: 15.0000 mg | ORAL_TABLET | Freq: Every day | ORAL | 5 refills | Status: DC

## 2017-06-02 NOTE — Progress Notes (Signed)
   HPI  Patient presents today here for follow up  Here for follow-up chronic medical conditions.  Anxiety and depression Patient is managed with Remeron and Lexapro, in the hopes that Remeron can stimulate appetite She states that she ran out of Remeron about 1 week ago, also her Xanax. She takes Xanax once daily.  She has a long history of being underweight, her PCP has discussed her case with me and is concerned about anorexia.  She is undergone thorough work-up for possible underlying malignancy.  Patient states that she is having withdrawals from not having Xanax for weight, however after discussion she states that she is only having loose stools.  She reports having a normal colonoscopy, Pap smear, and mammogram.   PMH: Smoking status noted ROS: Per HPI  Objective: BP 129/75   Pulse 85   Temp 98.9 F (37.2 C) (Oral)   Ht '5\' 4"'$  (1.626 m)   Wt 78 lb 3.2 oz (35.5 kg)   BMI 13.42 kg/m  Gen: NAD, alert, very thin HEENT: NCAT, EOMI, PERRL CV: RRR, good S1/S2, no murmur Resp: CTABL, no wheezes, non-labored Ext: No edema, warm Neuro: Alert and oriented, No gross deficits  Assessment and plan:  # anxiety and depression Doing ok, she has complex social difficulty currently.  Denies SI Refill remeron, continue lexapro Refill xanax  # underweight Discussed, she reports that she eats "sparingly"  I am concerned about anorexia.  UA looking for hematuria is normal.   # screening for breast cancer Mammo ordered  Close 2 month follow up   Orders Placed This Encounter  Procedures  . Microscopic Examination  . MM Digital Screening    Standing Status:   Future    Standing Expiration Date:   08/03/2018    Scheduling Instructions:     [facility] bus    Order Specific Question:   Reason for Exam (SYMPTOM  OR DIAGNOSIS REQUIRED)    Answer:   screening    Order Specific Question:   Is the patient pregnant?    Answer:   No    Order Specific Question:   Preferred imaging  location?    Answer:   External  . Urinalysis, Complete  . TSH  . CMP14+EGFR  . CBC with Differential/Platelet  . Lipid panel    Meds ordered this encounter  Medications  . ALPRAZolam (XANAX) 0.5 MG tablet    Sig: Take 1 tablet (0.5 mg total) by mouth 3 (three) times daily as needed.    Dispense:  30 tablet    Refill:  1    Not to exceed 5 additional fills before 04/16/2017  . mirtazapine (REMERON) 15 MG tablet    Sig: Take 1 tablet (15 mg total) by mouth at bedtime.    Dispense:  30 tablet    Refill:  [place], MD [facility] 06/02/2017, 5:01 PM

## 2017-06-02 NOTE — Patient Instructions (Signed)
Great to see you!  Come back in 6-8 weeks for follow up

## 2017-06-03 ENCOUNTER — Encounter: Payer: Self-pay | Admitting: Nurse Practitioner

## 2017-06-03 LAB — CBC WITH DIFFERENTIAL/PLATELET
BASOS: 1 %
Basophils Absolute: 0 10*3/uL (ref 0.0–0.2)
EOS (ABSOLUTE): 0.1 10*3/uL (ref 0.0–0.4)
Eos: 1 %
Hematocrit: 41 % (ref 34.0–46.6)
Hemoglobin: 14.1 g/dL (ref 11.1–15.9)
IMMATURE GRANS (ABS): 0 10*3/uL (ref 0.0–0.1)
Immature Granulocytes: 0 %
LYMPHS ABS: 2 10*3/uL (ref 0.7–3.1)
Lymphs: 35 %
MCH: 33.1 pg — AB (ref 26.6–33.0)
MCHC: 34.4 g/dL (ref 31.5–35.7)
MCV: 96 fL (ref 79–97)
MONOS ABS: 0.5 10*3/uL (ref 0.1–0.9)
Monocytes: 8 %
NEUTROS ABS: 3.2 10*3/uL (ref 1.4–7.0)
Neutrophils: 55 %
PLATELETS: 160 10*3/uL (ref 150–379)
RBC: 4.26 x10E6/uL (ref 3.77–5.28)
RDW: 13.3 % (ref 12.3–15.4)
WBC: 5.8 10*3/uL (ref 3.4–10.8)

## 2017-06-03 LAB — CMP14+EGFR
ALT: 11 IU/L (ref 0–32)
AST: 18 IU/L (ref 0–40)
Albumin/Globulin Ratio: 1.9 (ref 1.2–2.2)
Albumin: 4.5 g/dL (ref 3.5–5.5)
Alkaline Phosphatase: 57 IU/L (ref 39–117)
BILIRUBIN TOTAL: 0.5 mg/dL (ref 0.0–1.2)
BUN / CREAT RATIO: 13 (ref 9–23)
BUN: 12 mg/dL (ref 6–24)
CALCIUM: 9.3 mg/dL (ref 8.7–10.2)
CHLORIDE: 102 mmol/L (ref 96–106)
CO2: 25 mmol/L (ref 20–29)
Creatinine, Ser: 0.96 mg/dL (ref 0.57–1.00)
GFR, EST AFRICAN AMERICAN: 79 mL/min/{1.73_m2} (ref >59)
GFR, EST NON AFRICAN AMERICAN: 68 mL/min/{1.73_m2} (ref >59)
GLOBULIN, TOTAL: 2.4 g/dL (ref 1.5–4.5)
Glucose: 83 mg/dL (ref 65–99)
Potassium: 3.6 mmol/L (ref 3.5–5.2)
SODIUM: 143 mmol/L (ref 134–144)
TOTAL PROTEIN: 6.9 g/dL (ref 6.0–8.5)

## 2017-06-03 LAB — LIPID PANEL
Chol/HDL Ratio: 3 ratio (ref 0.0–4.4)
Cholesterol, Total: 186 mg/dL (ref 100–199)
HDL: 62 mg/dL (ref >39)
LDL Calculated: 102 mg/dL — ABNORMAL HIGH (ref 0–99)
Triglycerides: 112 mg/dL (ref 0–149)
VLDL CHOLESTEROL CAL: 22 mg/dL (ref 5–40)

## 2017-06-03 LAB — TSH: TSH: 9.54 u[IU]/mL — ABNORMAL HIGH (ref 0.450–4.500)

## 2017-06-11 ENCOUNTER — Other Ambulatory Visit: Payer: Self-pay | Admitting: Family Medicine

## 2017-06-11 MED ORDER — LEVOTHYROXINE SODIUM 75 MCG PO TABS: 75.0000 ug | ORAL_TABLET | Freq: Every day | ORAL | 1 refills | Status: DC

## 2017-06-19 ENCOUNTER — Telehealth: Payer: Self-pay | Admitting: *Deleted

## 2017-06-19 NOTE — Telephone Encounter (Signed)
Pt called in saying her xanax rx was written wrong and that she was supposed to take it 3 times daily but in the office notes from her visit with Dr Wendi Snipes it says she only takes it once a day. The rx was written for take TID but only for dispense #30. Pt was advised Dr Wendi Snipes was out of the office until Monday and that we would need to send the call to him and she was ok with this.

## 2017-06-21 ENCOUNTER — Other Ambulatory Visit: Payer: Self-pay | Admitting: Nurse Practitioner

## 2017-06-23 ENCOUNTER — Other Ambulatory Visit: Payer: Self-pay | Admitting: Nurse Practitioner

## 2017-06-23 MED ORDER — ALPRAZOLAM 0.5 MG PO TABS: 0.5000 mg | ORAL_TABLET | Freq: Three times a day (TID) | ORAL | 1 refills | Status: DC | PRN

## 2017-06-23 NOTE — Telephone Encounter (Signed)
Attempted to contact patient - NVM 

## 2017-06-23 NOTE — Telephone Encounter (Signed)
Rx Hx reviewed, ;t does usually receive 90 pillso fxanax at a time but had been several months.   Refill at 90 tabs, needs to see PCP for refill.   Laroy Apple, MD [facility] 06/23/2017, 7:42 AM

## 2017-07-08 NOTE — Telephone Encounter (Signed)
No response. Note filed. 

## 2017-07-14 ENCOUNTER — Encounter: Payer: Self-pay | Admitting: Family Medicine

## 2017-07-14 ENCOUNTER — Ambulatory Visit (INDEPENDENT_AMBULATORY_CARE_PROVIDER_SITE_OTHER): Payer: Medicare HMO | Admitting: Family Medicine

## 2017-07-14 VITALS — BP 143/80 | HR 82 | Temp 99.3°F | Ht 64.0 in | Wt 79.6 lb

## 2017-07-14 DIAGNOSIS — R636 Underweight: Secondary | ICD-10-CM | POA: Diagnosis not present

## 2017-07-14 DIAGNOSIS — E039 Hypothyroidism, unspecified: Secondary | ICD-10-CM

## 2017-07-14 DIAGNOSIS — F419 Anxiety disorder, unspecified: Secondary | ICD-10-CM | POA: Diagnosis not present

## 2017-07-14 DIAGNOSIS — F329 Major depressive disorder, single episode, unspecified: Secondary | ICD-10-CM | POA: Diagnosis not present

## 2017-07-14 DIAGNOSIS — F32A Depression, unspecified: Secondary | ICD-10-CM

## 2017-07-14 DIAGNOSIS — R69 Illness, unspecified: Secondary | ICD-10-CM | POA: Diagnosis not present

## 2017-07-14 NOTE — Progress Notes (Signed)
   HPI  Patient presents today here for follow up anxiety and underweight  Pending depression Patient has been taking Lexapro and Xanax for quite some time, she restarted Remeron and states that she feels well. She states that she feels balanced and happy in general. She is reporting increased hunger. She reports a good breakfast eating 2 eggs, sausage  Hypothyroidism Good medication compliance.  PMH: Smoking status noted ROS: Per HPI  Objective: BP (!) 143/80   Pulse 82   Temp 99.3 F (37.4 C) (Oral)   Ht '5\' 4"'$  (1.626 m)   Wt 79 lb 9.6 oz (36.1 kg)   BMI 13.66 kg/m  Gen: NAD, alert, cooperative with exam, very thin appearing HEENT: NCAT CV: RRR, good S1/S2, no murmur Resp: CTABL, no wheezes, non-labored Ext: No edema, warm Neuro: Alert and oriented, No gross deficits  Assessment and plan:  #Underweight Patient with modest weight gain, 1 pounds since last visit Continue Remeron Encouraged her to eat liberally  #Anxiety depression Controlled with Lexapro plus Remeron plus Xanax, no changes Follow-up 2 months  #Hypothyroidism Labs today  Orders Placed This Encounter  Procedures  . CBC with Differential/Platelet  . CMP14+EGFR  . TSH  . LDL Cholesterol, Direct    Laroy Apple, MD [facility] 07/14/2017, 4:57 PM

## 2017-07-14 NOTE — Patient Instructions (Signed)
Great to see you!  Come back to see myself or Dr. Evette Doffing in 2 months  Continue remeron and lexapro

## 2017-07-15 LAB — CBC WITH DIFFERENTIAL/PLATELET
Basophils Absolute: 0 10*3/uL (ref 0.0–0.2)
Basos: 1 %
EOS (ABSOLUTE): 0.1 10*3/uL (ref 0.0–0.4)
EOS: 2 %
Hematocrit: 39.2 % (ref 34.0–46.6)
Hemoglobin: 13.4 g/dL (ref 11.1–15.9)
Immature Grans (Abs): 0 10*3/uL (ref 0.0–0.1)
Immature Granulocytes: 0 %
LYMPHS ABS: 1.9 10*3/uL (ref 0.7–3.1)
Lymphs: 35 %
MCH: 32.9 pg (ref 26.6–33.0)
MCHC: 34.2 g/dL (ref 31.5–35.7)
MCV: 96 fL (ref 79–97)
MONOCYTES: 9 %
Monocytes Absolute: 0.5 10*3/uL (ref 0.1–0.9)
NEUTROS ABS: 2.9 10*3/uL (ref 1.4–7.0)
Neutrophils: 53 %
Platelets: 166 10*3/uL (ref 150–450)
RBC: 4.07 x10E6/uL (ref 3.77–5.28)
RDW: 13.4 % (ref 12.3–15.4)
WBC: 5.5 10*3/uL (ref 3.4–10.8)

## 2017-07-15 LAB — CMP14+EGFR
ALBUMIN: 4.4 g/dL (ref 3.5–5.5)
ALK PHOS: 53 IU/L (ref 39–117)
ALT: 15 IU/L (ref 0–32)
AST: 23 IU/L (ref 0–40)
Albumin/Globulin Ratio: 2 (ref 1.2–2.2)
BUN / CREAT RATIO: 30 — AB (ref 9–23)
BUN: 32 mg/dL — ABNORMAL HIGH (ref 6–24)
Bilirubin Total: <0.2 mg/dL (ref 0.0–1.2)
CO2: 23 mmol/L (ref 20–29)
CREATININE: 1.05 mg/dL — AB (ref 0.57–1.00)
Calcium: 9.2 mg/dL (ref 8.7–10.2)
Chloride: 104 mmol/L (ref 96–106)
GFR, EST AFRICAN AMERICAN: 70 mL/min/{1.73_m2} (ref >59)
GFR, EST NON AFRICAN AMERICAN: 61 mL/min/{1.73_m2} (ref >59)
Globulin, Total: 2.2 g/dL (ref 1.5–4.5)
Glucose: 76 mg/dL (ref 65–99)
Potassium: 4.1 mmol/L (ref 3.5–5.2)
SODIUM: 142 mmol/L (ref 134–144)
Total Protein: 6.6 g/dL (ref 6.0–8.5)

## 2017-07-15 LAB — LDL CHOLESTEROL, DIRECT: LDL Direct: 71 mg/dL (ref 0–99)

## 2017-07-15 LAB — TSH: TSH: 3.12 u[IU]/mL (ref 0.450–4.500)

## 2017-07-17 ENCOUNTER — Telehealth: Payer: Self-pay | Admitting: Family Medicine

## 2017-07-17 MED ORDER — DICLOFENAC SODIUM 1 % TD GEL: 2.0000 g | Freq: Four times a day (QID) | TRANSDERMAL | 2 refills | Status: DC

## 2017-07-17 NOTE — Telephone Encounter (Signed)
Patient

## 2017-07-17 NOTE — Telephone Encounter (Signed)
voltaren gel sent in.   Laroy Apple, MD [facility] 07/17/2017, 11:28 AM

## 2017-07-22 DIAGNOSIS — Z1231 Encounter for screening mammogram for malignant neoplasm of breast: Secondary | ICD-10-CM | POA: Diagnosis not present

## 2017-07-28 LAB — HM MAMMOGRAPHY

## 2017-08-20 ENCOUNTER — Ambulatory Visit (INDEPENDENT_AMBULATORY_CARE_PROVIDER_SITE_OTHER): Payer: Medicare HMO | Admitting: *Deleted

## 2017-08-20 ENCOUNTER — Encounter: Payer: Self-pay | Admitting: *Deleted

## 2017-08-20 VITALS — BP 113/70 | HR 78 | Ht 63.0 in | Wt 76.0 lb

## 2017-08-20 DIAGNOSIS — Z0000 Encounter for general adult medical examination without abnormal findings: Secondary | ICD-10-CM | POA: Diagnosis not present

## 2017-08-20 MED ORDER — ENSURE PLUS PO LIQD: 237.0000 mL | Freq: Two times a day (BID) | ORAL | 5 refills | Status: DC

## 2017-08-20 NOTE — Progress Notes (Addendum)
Subjective:   Roberta Brooks is a 52 y.o. female who presents for a Medicare Annual Wellness Visit. Roberta Brooks lives in a mobile home with her daughter, son-in-law, and their 3 children. She does not currently have a car but her daughter takes her to appointments and to run errands. States that there is a lack of food in the house. A number of causes were discussed but I won't list them all here.  Enjoys playing a Contractor called "clutter"    Review of Systems    Patient reports that her overall health is unchanged compared to last year.  Cardiac Risk Factors include: sedentary lifestyle;smoking/ tobacco exposure  Complains of fatigue. She has a BMP of 13  Endo: hypothyroidism that is currently regulated.   Psych: Dr Wendi Snipes has mentioned possible anorexia. Patient denies intentionally withholding food and denies any issues with body image.   All other systems negative     Current Medications (verified) Outpatient Encounter Medications as of 08/20/2017  Medication Sig  . ALPRAZolam (XANAX) 0.5 MG tablet Take 1 tablet (0.5 mg total) by mouth 3 (three) times daily as needed.  . diclofenac sodium (VOLTAREN) 1 % GEL Apply 2 g topically 4 (four) times daily.  Marland Kitchen escitalopram (LEXAPRO) 20 MG tablet Take 1 tablet (20 mg total) by mouth daily.  Marland Kitchen levothyroxine (SYNTHROID, LEVOTHROID) 75 MCG tablet Take 1 tablet (75 mcg total) by mouth daily.  . mirtazapine (REMERON) 15 MG tablet Take 1 tablet (15 mg total) by mouth at bedtime.  . [DISCONTINUED] ENSURE (ENSURE) Take 237 mLs by mouth every morning.  Marland Kitchen ENSURE PLUS (ENSURE PLUS) LIQD Take 237 mLs by mouth 2 (two) times daily between meals.   No facility-administered encounter medications on file as of 08/20/2017.     Allergies (verified) Penicillins   History: Past Medical History:  Diagnosis Date  . Allergy   . Anxiety   . Depression   . Diverticulosis of colon   . Eye muscle weakness   . Grave's disease   . Unspecified  hypothyroidism 09/16/2012   Past Surgical History:  Procedure Laterality Date  . CERVIX LESION DESTRUCTION    . COLONOSCOPY    . DENTAL SURGERY  07/18/2016  . ELBOW SURGERY    . EYE MUSCLE SURGERY    . HAND SURGERY    . TONSILLECTOMY AND ADENOIDECTOMY     Family History  Problem Relation Age of Onset  . Heart disease Mother   . Arthritis Mother   . Dementia Mother   . Graves' disease Father   . Healthy Daughter   . Healthy Son   . Graves' disease Paternal Grandmother   . Clotting disorder Brother   . Heart disease Brother   . Colon cancer Neg Hx   . Colon polyps Neg Hx   . Esophageal cancer Neg Hx   . Rectal cancer Neg Hx   . Stomach cancer Neg Hx    Social History   Socioeconomic History  . Marital status: Divorced    Spouse name: Not on file  . Number of children: 2  . Years of education: 9  . Highest education level: 9th grade  Occupational History  . Occupation: Disabled  Social Needs  . Financial resource strain: Very hard  . Food insecurity:    Worry: Often true    Inability: Often true  . Transportation needs:    Medical: No    Non-medical: No  Tobacco Use  . Smoking status: Current Every  Day Smoker    Packs/day: 0.50    Years: 39.00    Pack years: 19.50    Types: Cigarettes  . Smokeless tobacco: Never Used  . Tobacco comment: Not interested in quitting right now  Substance and Sexual Activity  . Alcohol use: No    Alcohol/week: 0.0 oz  . Drug use: No  . Sexual activity: Not Currently  Lifestyle  . Physical activity:    Days per week: 0 days    Minutes per session: 0 min  . Stress: Very much  Relationships  . Social connections:    Talks on phone: More than three times a week    Gets together: More than three times a week    Attends religious service: Never    Active member of club or organization: No    Attends meetings of clubs or organizations: Never    Relationship status: Divorced  Other Topics Concern  . Not on file  Social  History Narrative  . Not on file    Tobacco Use Yes.  Smoking cessation offered? No. Patient is not interested at this time.   Clinical Intake:  Pre-visit preparation completed: No  Pain : No/denies pain     Nutritional Status: BMI <19  Underweight Diabetes: No  How often do you need to have someone help you when you read instructions, pamphlets, or other written materials from your doctor or pharmacy?: 1 - Never What is the last grade level you completed in school?: 9th  Interpreter Needed?: No  Information entered by :: Chong Sicilian, RN   Activities of Daily Living In your present state of health, do you have any difficulty performing the following activities: 08/20/2017  Hearing? N  Vision? N  Difficulty concentrating or making decisions? N  Walking or climbing stairs? N  Dressing or bathing? N  Doing errands, shopping? N  Preparing Food and eating ? N  Using the Toilet? N  In the past six months, have you accidently leaked urine? N  Do you have problems with loss of bowel control? N  Managing your Medications? N  Managing your Finances? N  Housekeeping or managing your Housekeeping? N  Some recent data might be hidden     Diet Eating one meal a day at night because there's not enough food to go around Drinks tea, sprite, or 7 up. Doesn't like water. Drinks 2 Ensures a day but can only afford enough for half of the month   Exercise Current Exercise Habits: The patient does not participate in regular exercise at present, Exercise limited by: None identified   Depression Screen PHQ 2/9 Scores 08/20/2017 07/14/2017 06/02/2017 07/26/2016 06/13/2016 05/30/2016 05/02/2016  PHQ - 2 Score 0 0 0 0 4 0 0  PHQ- 9 Score - - - - 13 - -     Fall Risk Fall Risk  08/20/2017 07/14/2017 06/02/2017 07/26/2016 06/13/2016  Falls in the past year? No No No No No    Safety Is the patient's home free of loose throw rugs in walkways, pet beds, electrical cords, etc?   yes      Grab bars in  the bathroom? no      Walkin shower? no      Shower Seat? no      Handrails on the stairs?   yes      Adequate lighting?   yes  Patient Care Team: Chevis Pretty, FNP as PCP - General (Nurse Practitioner) Arlana Lindau, RD as Dietitian (Nutrition)  No hospitalizations, ER visits, or surgeries this past year.  Objective:    Today's Vitals   08/20/17 1515  BP: 113/70  Pulse: 78  Weight: 76 lb (34.5 kg)  Height: 5\' 3"  (1.6 m)   Body mass index is 13.46 kg/m.  Advanced Directives 08/20/2017 07/26/2016 06/12/2016 04/13/2015 01/26/2015 12/14/2014  Does Patient Have a Medical Advance Directive? No No No No No No  Would patient like information on creating a medical advance directive? No - Patient declined Yes (MAU/Ambulatory/Procedural Areas - Information given) - No - patient declined information - -    Hearing/Vision  normal or No deficits noted during visit.  Cognitive Function: MMSE - Mini Mental State Exam 08/20/2017 07/26/2016 07/26/2016  Orientation to time 4 5 5   Orientation to Place 5 5 5   Registration 3 3 3   Attention/ Calculation 5 5 4   Recall 3 0 0  Language- name 2 objects 2 2 2   Language- repeat 1 1 1   Language- follow 3 step command 3 3 3   Language- read & follow direction 1 1 1   Write a sentence 1 1 1   Copy design 1 1 1   Total score 29 27 26        Normal Cognitive Function Screening: Yes    Immunizations and Health Maintenance Immunization History  Administered Date(s) Administered  . Influenza,inj,Quad PF,6+ Mos 01/20/2013, 12/26/2014, 12/07/2015   There are no preventive care reminders to display for this patient. Health Maintenance  Topic Date Due  . TETANUS/TDAP  08/21/2018 (Originally 06/19/1984)  . INFLUENZA VACCINE  09/25/2017  . PAP SMEAR  11/14/2017  . MAMMOGRAM  07/29/2018  . COLONOSCOPY  01/25/2025  . HIV Screening  Completed        Assessment:   This is a routine wellness examination for Sela.    Plan:    Goals    . DIET  - INCREASE WATER INTAKE    . Exercise 3x per week (30 min per time)     Walk for 30 minutes daily        Health Maintenance Recommendations: Pap recommended at the end of the year   Additional Screening Recommendations: Lung: Low Dose CT Chest recommended if Age 75-80 years, 30 pack-year currently smoking OR have quit w/in 15years. Patient does not qualify. Hepatitis C Screening recommended: no  Today's Orders Orders Placed This Encounter  Procedures  . HM MAMMOGRAPHY    This external order was created through the Results Console.  (Mammogram resulted documented)  Keep f/u with Chevis Pretty, FNP and any other specialty appointments you may have Continue current medications Move carefully to avoid falls. Aim for at least 150 minutes of moderate activity a week. This can be done with chair exercises if necessary. Read or work on puzzles daily Stay connected with friends and family  Spoke with [place] about Medicaid coverage for [employer]. Stated that normally patient has to have tube feedings in order to qualify. I will reach out to the CAP program and see if they can provide some assistance.   Provided patient with phone number for the health department and social services. She may benefit from a case worker. Also gave them information about the food bank in [place] in [place].   I have personally reviewed and noted the following in the patient's chart:   . Medical and social history . Use of alcohol, tobacco or illicit drugs  . Current medications and supplements . Functional ability and status . Nutritional status .  Physical activity . Advanced directives . List of other physicians . Hospitalizations, surgeries, and ER visits in previous 12 months . Vitals . Screenings to include cognitive, depression, and falls . Referrals and appointments  In addition, I have reviewed and discussed with patient certain preventive protocols, quality  metrics, and best practice recommendations. A written personalized care plan for preventive services as well as general preventive health recommendations were provided to patient.     Chong Sicilian, RN   08/20/2017    I have reviewed and agree with the above AWV documentation.   Terald Sleeper PA-C [facility] [address]  [place], [place] [address] [phone]

## 2017-08-20 NOTE — Patient Instructions (Addendum)
Roberta Brooks, Thank you for taking time to come for your Medicare Wellness Visit. I appreciate your ongoing commitment to your health goals. Please review the following plan we discussed and let me know if I can assist you in the future.   These are the goals we discussed: Goals    . Exercise 3x per week (30 min per time)     Walk for 30 minutes daily       This is a list of the screening recommended for you and due dates:  Health Maintenance  Topic Date Due  . Tetanus Vaccine  08/21/2018*  . Flu Shot  09/25/2017  . Pap Smear  11/14/2017  . Mammogram  07/29/2018  . Colon Cancer Screening  01/25/2025  . HIV Screening  Completed  *Topic was postponed. The date shown is not the original due date.     [facility] and [employer]  Address: [place], [place], [place] [address] [address] [place] #: [id number] Phone: [phone] Fax Number: [phone], [phone] or [phone] Emergency Phone: [phone]  Reach out to the food bank in [place] in [place].   [employer] of [place], [place]  Thank you for visiting the [employer] site! If you are not familiar with our organization or would like to know more about [place], we'd like you to know that [employer] is made up solely of loving, caring volunteers who give generously of their time and resources to make this ministry in the community such a success! As a 501(c)(3) Organization, [employer] is an affiliation of churches, businesses, and individuals who come together in order to provide charitable services to people who live in the greater [place] area. Whether you have a need, or want to join [place] in helping meet the needs of others, we invite you to contact us. You can also learn more about the ministry on the [employer] Facebook page: www.Ellustrate.fi   To contact one of the [employer] representatives, please call Cyndy Freeze at [phone] or email Terri at [email or web address]. To contact individual [employer] churches by phone or through their web sites, please visit the [employer] "Who We Are" web page.   In JUNE 2019:  Food Pantry and [place] will be open on June 29, the 5th Saturday of the month.    [address] in [place] If you are having a hard time, let us help you. Don't struggle alone against the hardships of life. We are here to lend you a hand. If you need a hot meal, groceries, assistance with finding housing or just someone to listen, contact us. Today.  [place],  [address] What we offer  The Food Pantry at [place] in need of groceries can register with [place] for a free box of high quality food. This box will feed a family of four. Boxes are available on the last Saturday of each month beginning at 9 am at our store in [place], [place]. To register for a box of food, please call [phone]. The [facility] at [facility] - A warm welcome and a hot meal awaits you at The Well. Free hot lunches are served on Thursday & Friday from 1-3pm and Saturday from [place] our [place] location. We also do an unofficial breakfast on Saturdays from 9-11am. Come join [place] for good food and great fellowship with members of the community who care about you. [facility] [employer]  Thrift Shoppe - If you are in need of gently used items of clothing for the entire family, housewares, linens, nursery items, toys and more, then check out [place]. Ask about participating in our Learn to [employer] to earn points to use towards purchases in the store.  The store is open Tuesday - 12-5pm Wednesday & Thursday - 10-5 pm Friday - 10-7 pm Saturday - 10-5 pm Closed on Sunday & Monday  [employer] - We believe that everyone needs food for their soul as they make their way through life. Come join [place] for a non-denominational service that includes songs, a time of prayer and an encouraging devotion. We meet at 9  am on Saturday mornings in our [place] location.  Ways that we can assist you Learn to [facility] - Though it may not seem like it, learning to live on a budget can be fun. Budgeting enables you to meet your obligations without the stress of wondering if you will have enough to make ends meet until the end of the month. LOT offers several classes to help you learn to manage your finances successfully. Our caring teachers want to see you succeed and will do all they can to help you. The current schedule of classes is available here. As a bonus, attendance at these classes earns points that can be used towards items in our [place]. What could be better than an education that earns you a shopping trip??? (Points can be used toward the purchase of any item in the LOTs of [place] except for the items that are in the store on consignment.) Access to [employer] - If you are in need of assistance and don't know where to turn, give [place] a call. We can help you find places that provide groceries, clothing, mentoring, job coaching, basic skills training, soup kitchens, and some assistance with utility and rent bills.

## 2017-09-11 ENCOUNTER — Other Ambulatory Visit: Payer: Self-pay | Admitting: Physician Assistant

## 2017-09-11 ENCOUNTER — Other Ambulatory Visit: Payer: Self-pay | Admitting: Nurse Practitioner

## 2017-09-11 MED ORDER — ALPRAZOLAM 0.5 MG PO TABS: 0.5000 mg | ORAL_TABLET | Freq: Three times a day (TID) | ORAL | 0 refills | Status: DC | PRN

## 2017-09-11 NOTE — Telephone Encounter (Signed)
Pt has appt with Wendi Snipes 09/15/17 and would like refill on xanax.

## 2017-09-11 NOTE — Telephone Encounter (Signed)
Patient's daughter notified.

## 2017-09-11 NOTE — Telephone Encounter (Signed)
[place] with refill  I am moving to another [place] in 2 weeks and my DEA number address was mistakenly changed prematurely.  He has been changed back to [place] but has still not been updated in the [facility] system.  I have prescribed the patient's medications and discussed filling temporarily with a different provider in my practice.   Laroy Apple, MD [facility] 09/11/2017, 2:10 PM

## 2017-09-11 NOTE — Telephone Encounter (Signed)
sent 

## 2017-09-15 ENCOUNTER — Ambulatory Visit (INDEPENDENT_AMBULATORY_CARE_PROVIDER_SITE_OTHER): Payer: Medicare HMO | Admitting: Family Medicine

## 2017-09-15 ENCOUNTER — Encounter: Payer: Self-pay | Admitting: Family Medicine

## 2017-09-15 VITALS — BP 158/72 | HR 75 | Temp 98.1°F | Ht 63.0 in | Wt 77.6 lb

## 2017-09-15 DIAGNOSIS — F329 Major depressive disorder, single episode, unspecified: Secondary | ICD-10-CM

## 2017-09-15 DIAGNOSIS — F419 Anxiety disorder, unspecified: Secondary | ICD-10-CM

## 2017-09-15 DIAGNOSIS — R636 Underweight: Secondary | ICD-10-CM | POA: Diagnosis not present

## 2017-09-15 DIAGNOSIS — F32A Depression, unspecified: Secondary | ICD-10-CM

## 2017-09-15 DIAGNOSIS — R69 Illness, unspecified: Secondary | ICD-10-CM | POA: Diagnosis not present

## 2017-09-15 DIAGNOSIS — Z720 Tobacco use: Secondary | ICD-10-CM

## 2017-09-15 MED ORDER — ENSURE PLUS PO LIQD: 237.0000 mL | Freq: Two times a day (BID) | ORAL | 5 refills | Status: DC

## 2017-09-15 NOTE — Progress Notes (Signed)
   HPI  Patient presents today here to follow-up for anxiety depression and weight.  Patient has struggled with her weight for quite some time.  Her previous PCP felt that she may have anorexia.  We have recommended and had her see a dietitian.  She has been screened for occult malignancy with colonoscopy, mammogram, Pap smear -which were unrevealing.  Patient states that she is used Ensure intermittently Her depression anxiety she states is stable, she cannot tell much of a difference on Remeron.  Since her last visit she is down 1 and 1/2 pounds  PMH: Smoking status noted ROS: Per HPI  Objective: BP (!) 158/72   Pulse 75   Temp 98.1 F (36.7 C) (Oral)   Ht 5\' 3"  (1.6 m)   Wt 77 lb 9.6 oz (35.2 kg)   BMI 13.75 kg/m  Gen: NAD, alert, cooperative with exam, thin appearing HEENT: NCAT,  poor dentition CV: RRR, good S1/S2, no murmur Resp: CTABL, no wheezes, non-labored Ext: No edema, warm Neuro: Alert and oriented, No gross deficits      Assessment and plan:  #Underweight We discussed using Ensure for calorie supplementation, prescription sent Continue Remeron, however she cannot tell a significant improvement  #Tobacco abuse Recommended quitting, she states that it is difficult, she is pre-contemplative  #Anxiety depression Continue Celexa plus Remeron, no changes for now Also using xanax    Meds ordered this encounter  Medications  . ENSURE PLUS (ENSURE PLUS) LIQD    Sig: Take 237 mLs by mouth 2 (two) times daily between meals.    Dispense:  60 Can    Refill:  5    Underweight with BMI of 13.46 kg/m2 (R63.6)    Laroy Apple, MD [facility] 09/15/2017, 4:42 PM

## 2017-09-15 NOTE — Patient Instructions (Signed)
Great to see you!  Come back to follow up for your weight in 6-8 weeks  Start ensure, it is at the pharmacy.

## 2017-09-18 ENCOUNTER — Other Ambulatory Visit: Payer: Self-pay | Admitting: Nurse Practitioner

## 2017-10-02 ENCOUNTER — Telehealth: Payer: Self-pay | Admitting: Nurse Practitioner

## 2017-10-03 NOTE — Telephone Encounter (Signed)
Talked with Otila Kluver. I have a prescription for Ensure but when I spoke with [place] they said that the only way insurance would pay for a nutritional supplement like Ensure is if she receives it through a feeding tube. She takes it orally so that doesn't apply. Also, the CAP program has a 6 month waiting list. I don't know that she would qualify but she has been put on the waiting list and they will do an assessment.

## 2017-11-10 ENCOUNTER — Ambulatory Visit: Payer: Medicare HMO | Admitting: Pediatrics

## 2017-11-12 ENCOUNTER — Other Ambulatory Visit: Payer: Self-pay | Admitting: Physician Assistant

## 2017-11-28 ENCOUNTER — Ambulatory Visit: Payer: Medicare HMO | Admitting: Nurse Practitioner

## 2017-12-05 ENCOUNTER — Ambulatory Visit (INDEPENDENT_AMBULATORY_CARE_PROVIDER_SITE_OTHER): Payer: Medicare HMO | Admitting: Pediatrics

## 2017-12-05 ENCOUNTER — Encounter: Payer: Self-pay | Admitting: Pediatrics

## 2017-12-05 VITALS — BP 132/78 | HR 84 | Temp 98.0°F | Ht 63.0 in | Wt 80.4 lb

## 2017-12-05 DIAGNOSIS — G8929 Other chronic pain: Secondary | ICD-10-CM | POA: Diagnosis not present

## 2017-12-05 DIAGNOSIS — F32A Depression, unspecified: Secondary | ICD-10-CM

## 2017-12-05 DIAGNOSIS — Z23 Encounter for immunization: Secondary | ICD-10-CM | POA: Diagnosis not present

## 2017-12-05 DIAGNOSIS — Z720 Tobacco use: Secondary | ICD-10-CM | POA: Diagnosis not present

## 2017-12-05 DIAGNOSIS — R636 Underweight: Secondary | ICD-10-CM | POA: Diagnosis not present

## 2017-12-05 DIAGNOSIS — M549 Dorsalgia, unspecified: Secondary | ICD-10-CM

## 2017-12-05 DIAGNOSIS — F329 Major depressive disorder, single episode, unspecified: Secondary | ICD-10-CM

## 2017-12-05 DIAGNOSIS — F419 Anxiety disorder, unspecified: Secondary | ICD-10-CM

## 2017-12-05 DIAGNOSIS — E039 Hypothyroidism, unspecified: Secondary | ICD-10-CM | POA: Diagnosis not present

## 2017-12-05 DIAGNOSIS — R69 Illness, unspecified: Secondary | ICD-10-CM | POA: Diagnosis not present

## 2017-12-05 MED ORDER — ESCITALOPRAM OXALATE 20 MG PO TABS: 20.0000 mg | ORAL_TABLET | Freq: Every day | ORAL | 2 refills | Status: DC

## 2017-12-05 MED ORDER — MIRTAZAPINE 30 MG PO TABS: 30.0000 mg | ORAL_TABLET | Freq: Every day | ORAL | 4 refills | Status: DC

## 2017-12-05 MED ORDER — DICLOFENAC SODIUM 1 % TD GEL: 2.0000 g | Freq: Four times a day (QID) | TRANSDERMAL | 2 refills | Status: DC

## 2017-12-05 MED ORDER — LEVOTHYROXINE SODIUM 75 MCG PO TABS: 75.0000 ug | ORAL_TABLET | Freq: Every day | ORAL | 1 refills | Status: DC

## 2017-12-05 MED ORDER — ALPRAZOLAM 0.5 MG PO TABS: 0.5000 mg | ORAL_TABLET | Freq: Two times a day (BID) | ORAL | 5 refills | Status: DC | PRN

## 2017-12-05 NOTE — Progress Notes (Signed)
Subjective:   Patient ID: Roberta Brooks, female    DOB: Apr 29, 1965, 52 y.o.   MRN: [id number] CC: Establish Care And follow-up multiple medical problems HPI: Roberta Brooks is a 52 y.o. female   Underweight: Drinking mostly sprite and tea.  Has seen nutritionist in the past.  Trying to take Ensure twice a day, think she will only have enough to be able to do it once a day now.  Access to food has not been a problem per pt, does take advantage of food banks in area.  She does feel hungry, tries to eat more when she is feeling.  Denies skipping any meals.  No nausea, vomiting, stomach pain.  She is is just now due for 3-year Pap smear follow-up, otherwise up-to-date on routine preventive cancer screening.  Hypothyroidism: Taking medicine regularly  Anxiety and depression: Taking Lexapro every day, recently started on mirtazapine to take at night has been helpful for sleep.  Thinks her mood is been doing fine.  She was evaluated by behavioral health a couple years ago, her weight loss was determined to be medical.  Tobacco use: Not interested in cessation.  Smoking half to 1 pack a day.  Relevant past medical, surgical, family and social history reviewed. Allergies and medications reviewed and updated. Social History   Tobacco Use  Smoking Status Current Every Day Smoker  . Packs/day: 0.50  . Years: 39.00  . Pack years: 19.50  . Types: Cigarettes  Smokeless Tobacco Never Used  Tobacco Comment   Not interested in quitting right now   ROS: Per HPI   Objective:    BP 132/78   Pulse 84   Temp 98 F (36.7 C) (Oral)   Ht _0  (1.6 m)   Wt 80 lb 6.4 oz (36.5 kg)   BMI 14.24 kg/m   Wt Readings from Last 3 Encounters:  12/05/17 80 lb 6.4 oz (36.5 kg)  09/15/17 77 lb 9.6 oz (35.2 kg)  08/20/17 76 lb (34.5 kg)    Gen: NAD, alert, cooperative with exam, NCAT EYES: EOMI, no conjunctival injection, or no icterus ENT: OP without erythema LYMPH: no cervical LAD CV: NRRR, normal S1/S2,  no murmur, distal pulses 2+ b/l Resp: CTABL, no wheezes, normal WOB Abd: +BS, soft, NTND.  Ext: No edema, warm Neuro: Alert and oriented MSK: normal muscle bulk  Assessment & Plan:  Shanta was seen today for establish care.  Diagnoses and all orders for this visit:  Anxiety and depression Some ongoing symptoms, worse when without cigarettes.  Will increase mirtazapine to 30 mg.  Feels safe at home.  No thoughts of self-harm. -     mirtazapine (REMERON) 30 MG tablet; Take 1 tablet (30 mg total) by mouth at bedtime. -     escitalopram (LEXAPRO) 20 MG tablet; Take 1 tablet (20 mg total) by mouth daily. -     ALPRAZolam (XANAX) 0.5 MG tablet; Take 1 tablet (0.5 mg total) by mouth 2 (two) times daily as needed.  Underweight Continue regular if some of the day, goal to 6-day.  Declines seeing a nutritionist. -     TSH -     CMP14+EGFR -     CBC with Differential/Platelet  Chronic midline back pain, unspecified back location Stable, continue below -     diclofenac sodium (VOLTAREN) 1 % GEL; Apply 2 g topically 4 (four) times daily.  Hypothyroidism, unspecified type We will recheck thyroid level.  Continue below. -     levothyroxine (  SYNTHROID, LEVOTHROID) 75 MCG tablet; Take 1 tablet (75 mcg total) by mouth daily.  Tobacco use Continue to encourage cessation.  Follow up plan: Return in about 3 months (around 03/07/2018) for well exam. [name], MD [place]

## 2017-12-06 LAB — CBC WITH DIFFERENTIAL/PLATELET
BASOS ABS: 0 10*3/uL (ref 0.0–0.2)
Basos: 1 %
EOS (ABSOLUTE): 0.1 10*3/uL (ref 0.0–0.4)
Eos: 1 %
Hematocrit: 39.2 % (ref 34.0–46.6)
Hemoglobin: 13.5 g/dL (ref 11.1–15.9)
IMMATURE GRANS (ABS): 0 10*3/uL (ref 0.0–0.1)
IMMATURE GRANULOCYTES: 0 %
LYMPHS: 31 %
Lymphocytes Absolute: 1.4 10*3/uL (ref 0.7–3.1)
MCH: 33 pg (ref 26.6–33.0)
MCHC: 34.4 g/dL (ref 31.5–35.7)
MCV: 96 fL (ref 79–97)
Monocytes Absolute: 0.4 10*3/uL (ref 0.1–0.9)
Monocytes: 9 %
NEUTROS ABS: 2.6 10*3/uL (ref 1.4–7.0)
Neutrophils: 58 %
PLATELETS: 161 10*3/uL (ref 150–450)
RBC: 4.09 x10E6/uL (ref 3.77–5.28)
RDW: 11.9 % — ABNORMAL LOW (ref 12.3–15.4)
WBC: 4.4 10*3/uL (ref 3.4–10.8)

## 2017-12-06 LAB — CMP14+EGFR
A/G RATIO: 2.3 — AB (ref 1.2–2.2)
ALT: 11 IU/L (ref 0–32)
AST: 16 IU/L (ref 0–40)
Albumin: 4.6 g/dL (ref 3.5–5.5)
Alkaline Phosphatase: 53 IU/L (ref 39–117)
BILIRUBIN TOTAL: 0.5 mg/dL (ref 0.0–1.2)
BUN/Creatinine Ratio: 21 (ref 9–23)
BUN: 24 mg/dL (ref 6–24)
CO2: 23 mmol/L (ref 20–29)
Calcium: 9.3 mg/dL (ref 8.7–10.2)
Chloride: 104 mmol/L (ref 96–106)
Creatinine, Ser: 1.12 mg/dL — ABNORMAL HIGH (ref 0.57–1.00)
GFR calc Af Amer: 65 mL/min/{1.73_m2} (ref >59)
GFR calc non Af Amer: 56 mL/min/{1.73_m2} — ABNORMAL LOW (ref >59)
Globulin, Total: 2 g/dL (ref 1.5–4.5)
Glucose: 90 mg/dL (ref 65–99)
POTASSIUM: 4.2 mmol/L (ref 3.5–5.2)
Sodium: 141 mmol/L (ref 134–144)
Total Protein: 6.6 g/dL (ref 6.0–8.5)

## 2017-12-06 LAB — TSH: TSH: 0.932 u[IU]/mL (ref 0.450–4.500)

## 2018-03-10 ENCOUNTER — Encounter: Payer: Medicare HMO | Admitting: Pediatrics

## 2018-05-09 ENCOUNTER — Other Ambulatory Visit: Payer: Self-pay | Admitting: Pediatrics

## 2018-05-09 DIAGNOSIS — F32A Depression, unspecified: Secondary | ICD-10-CM

## 2018-05-09 DIAGNOSIS — F329 Major depressive disorder, single episode, unspecified: Secondary | ICD-10-CM

## 2018-05-09 DIAGNOSIS — R636 Underweight: Secondary | ICD-10-CM

## 2018-05-09 DIAGNOSIS — F419 Anxiety disorder, unspecified: Secondary | ICD-10-CM

## 2018-05-20 ENCOUNTER — Other Ambulatory Visit: Payer: Self-pay | Admitting: Pediatrics

## 2018-05-20 DIAGNOSIS — E039 Hypothyroidism, unspecified: Secondary | ICD-10-CM

## 2018-06-06 IMAGING — CT CT MAXILLOFACIAL W/O CM
3 series · 15 of 47 positions shown, 18 images · non-contrast
Comparison: None.

CLINICAL DATA: Dental abscess, concern for deep tissue infection

EXAM:
CT MAXILLOFACIAL WITHOUT CONTRAST
TECHNIQUE: Multidetector CT imaging of the maxillofacial structures was
performed. Multiplanar CT image reconstructions were also generated.
A small metallic BB was placed on the right temple in order to
reliably differentiate right from left.

[Series 2: max soft · axial · 0.33mm/px · z∈[-102,+30]mm · 9 of 78 slices shown, 12 images]
[im 6/78  brain]
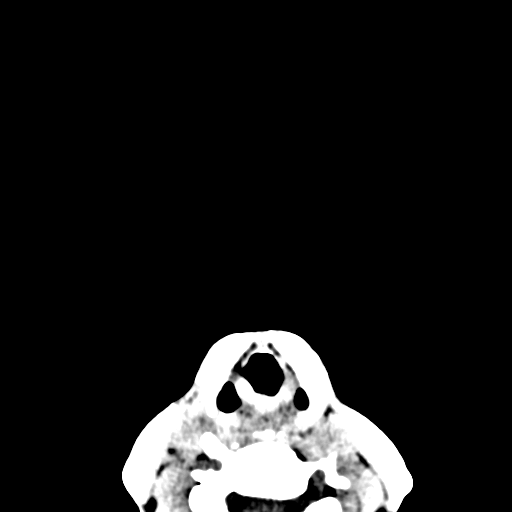
[im 6/78  bone]
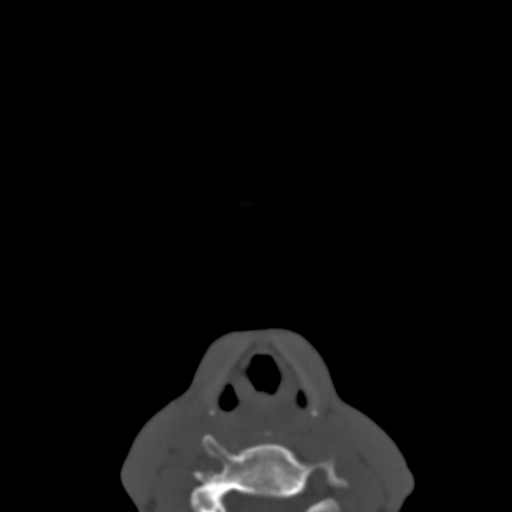
[im 14/78  bone]
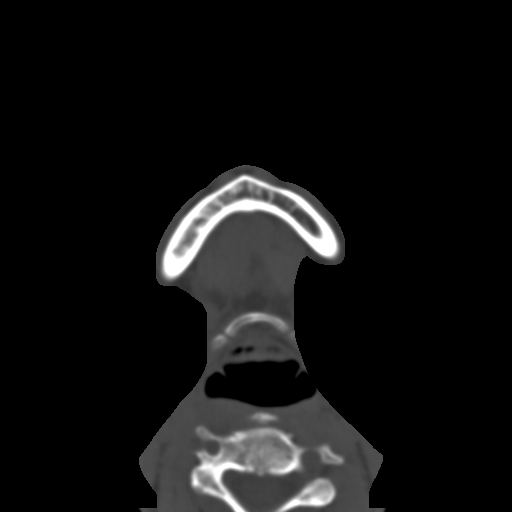
[im 22/78  bone]
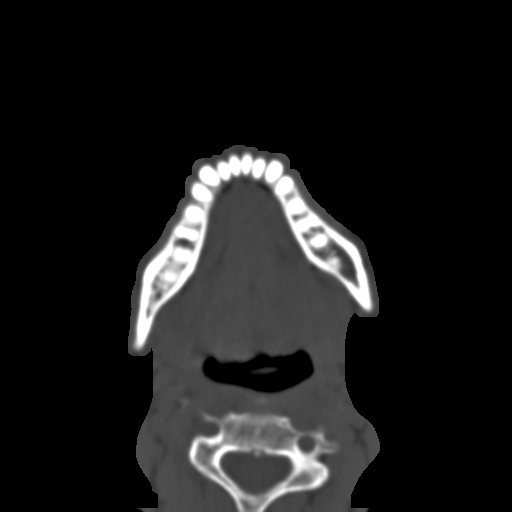
[im 30/78  bone]
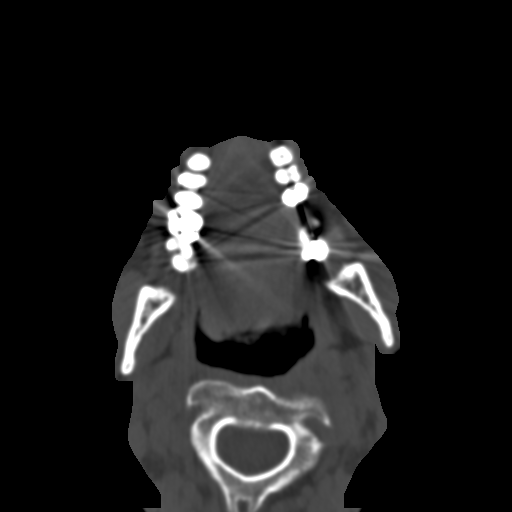
[im 40/78  brain]
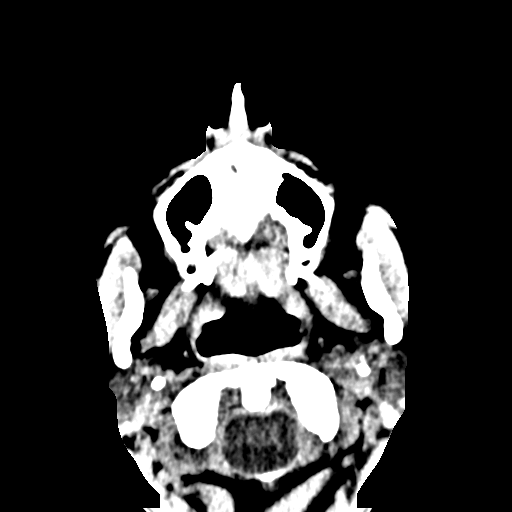
[im 40/78  bone]
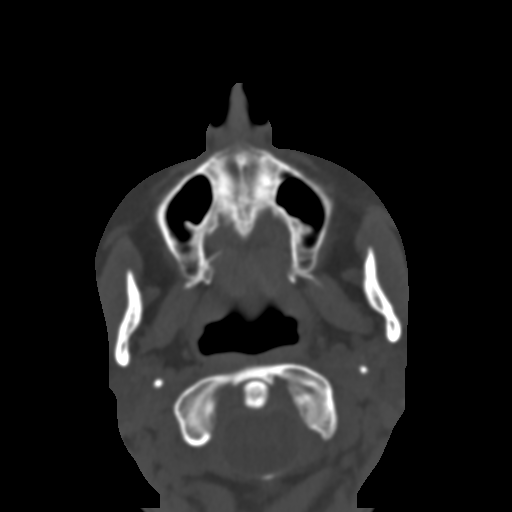
[im 48/78  bone]
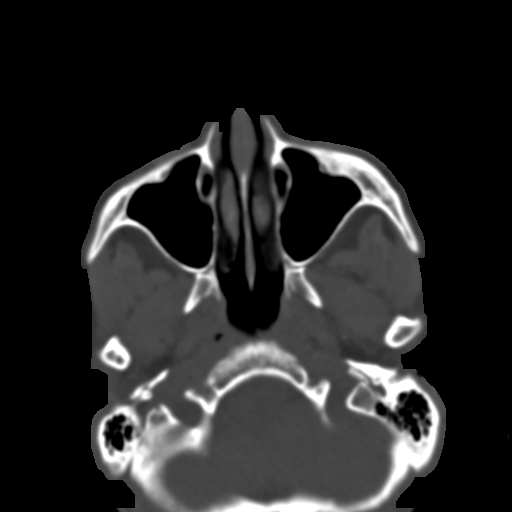
[im 56/78  bone]
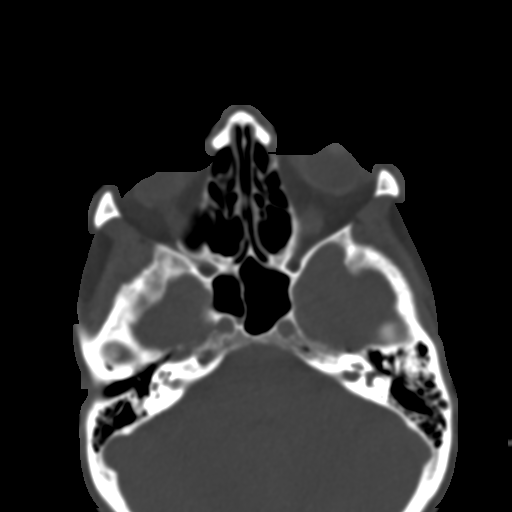
[im 64/78  bone]
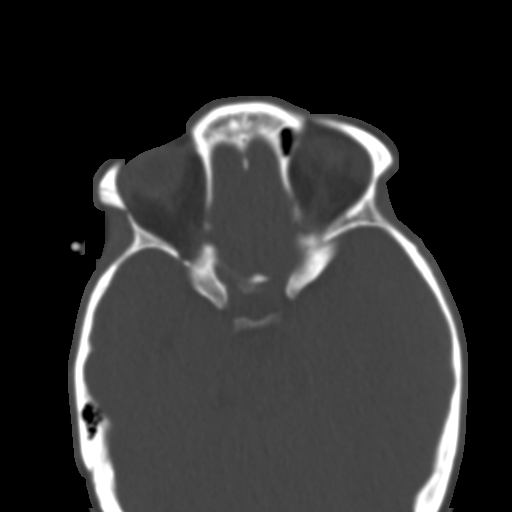
[im 72/78  brain]
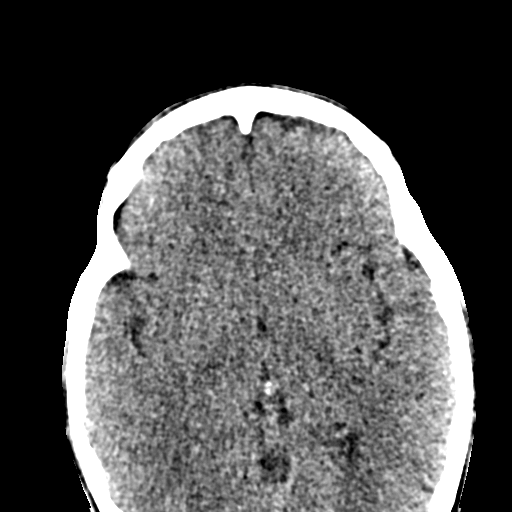
[im 72/78  bone]
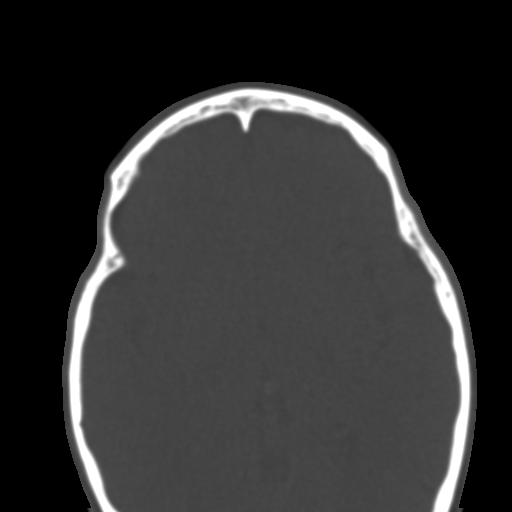

[Series 6: coronal soft · coronal · 0.28mm/px · 3 of 65 slices shown]
[im 22/65  bone]
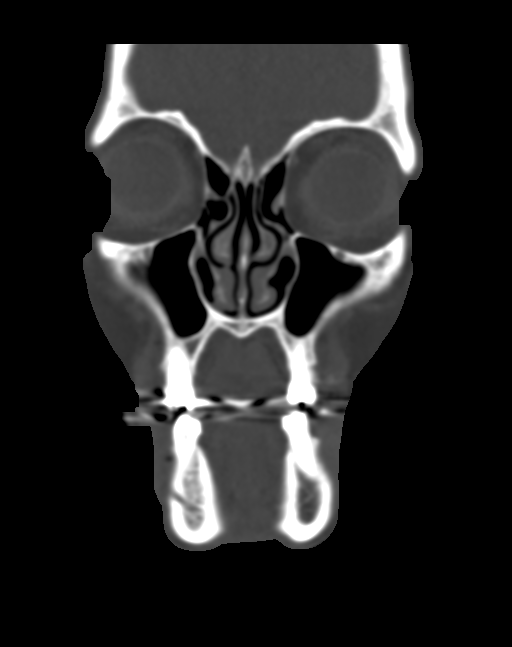
[im 29/65  bone]
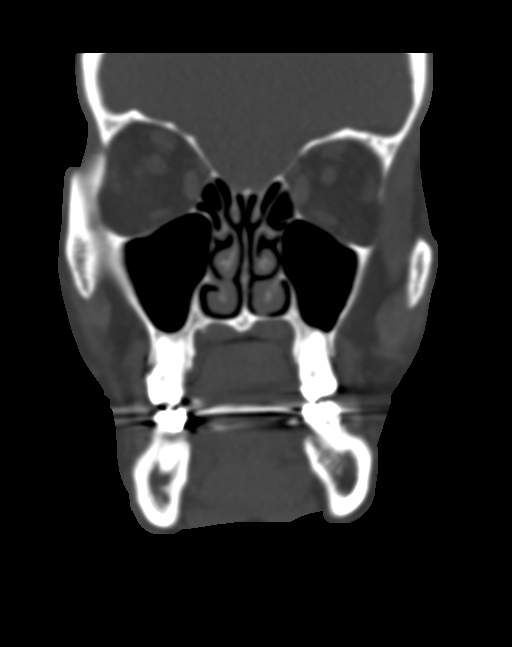
[im 36/65  bone]
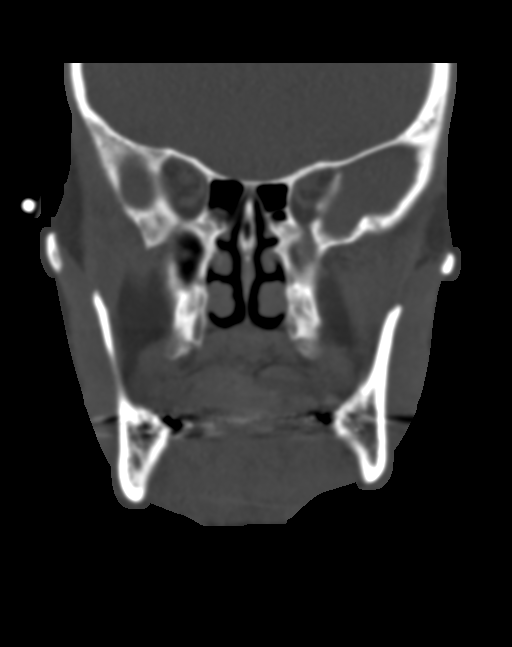

[Series 7: sagittal soft · sagittal · 0.30mm/px · 3 of 68 slices shown]
[im 23/68  bone]
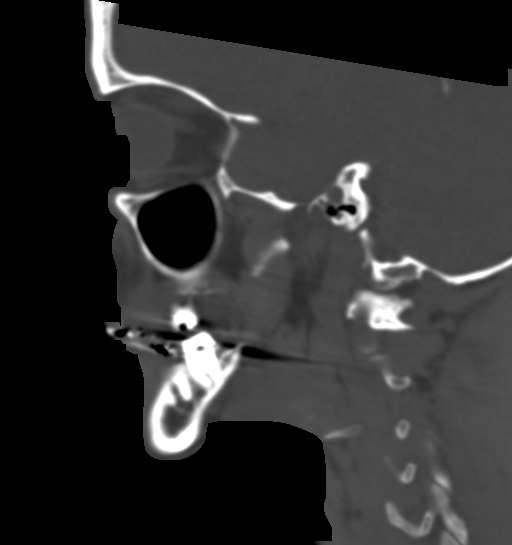
[im 34/68  bone]
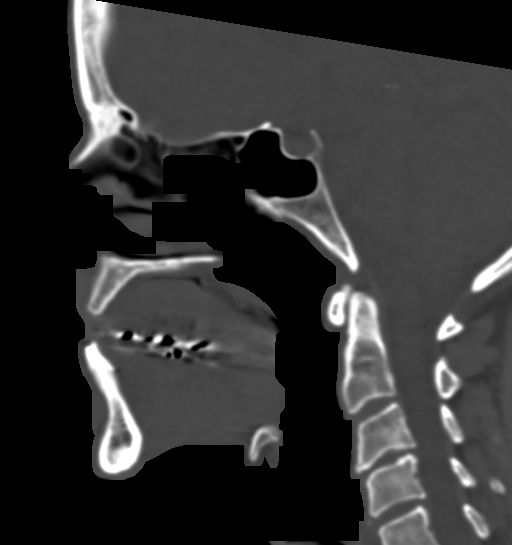
[im 45/68  bone]
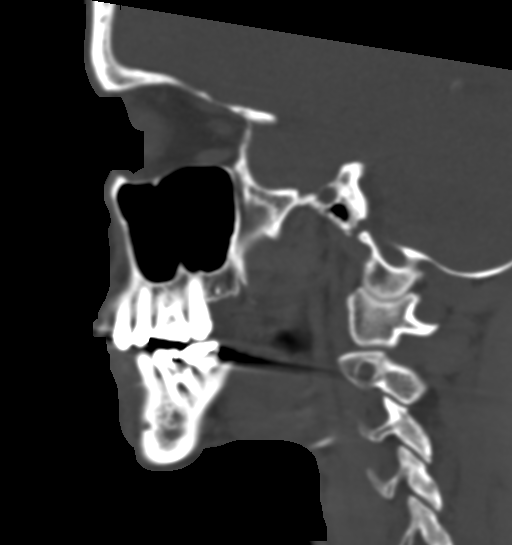

[15 of 47 positions shown; findings below may reference images not displayed]

FINDINGS: Osseous: Apical root abscess with overlying cortical break along the
root of the right lower 1st molar (tooth #30).

Bilateral mandibular condyles are well-seated in the TMJs.

No evidence of maxillofacial fracture.

Orbits: Bilateral orbits, including the globes and retroconal soft
tissues, are within normal limits.

Sinuses: Visualized paranasal sinuses and mastoid air cells are
clear.

Soft tissues: No evidence of odontogenic abscess.

Mild subcutaneous gas adjacent to the right lower mandible (series
2/image 87), corresponding to the known cutaneous tract.

Limited intracranial: No significant or unexpected finding.
IMPRESSION: Apical root abscess with overlying cortical break involving the
right lower 1st molar.

No evidence of odontogenic abscess.

Mild subcutaneous gas adjacent to the right lower mandible,
corresponding to the known cutaneous tract.

## 2018-06-11 ENCOUNTER — Other Ambulatory Visit: Payer: Self-pay | Admitting: Pediatrics

## 2018-06-11 DIAGNOSIS — F32A Depression, unspecified: Secondary | ICD-10-CM

## 2018-06-11 DIAGNOSIS — F329 Major depressive disorder, single episode, unspecified: Secondary | ICD-10-CM

## 2018-06-11 DIAGNOSIS — R636 Underweight: Secondary | ICD-10-CM

## 2018-06-11 DIAGNOSIS — F419 Anxiety disorder, unspecified: Secondary | ICD-10-CM

## 2018-06-11 NOTE — Telephone Encounter (Signed)
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

## 2018-06-11 NOTE — Telephone Encounter (Signed)
Patient would like to est with Lenna Gilford- is afraid to come into the office due to the [place]. Patient is wanting to know if she can do a face time for med refills and then come in after COVID has gotten better. Please advise

## 2018-06-13 ENCOUNTER — Other Ambulatory Visit: Payer: Self-pay | Admitting: Family Medicine

## 2018-06-13 DIAGNOSIS — F329 Major depressive disorder, single episode, unspecified: Secondary | ICD-10-CM

## 2018-06-13 DIAGNOSIS — R636 Underweight: Secondary | ICD-10-CM

## 2018-06-13 DIAGNOSIS — F32A Depression, unspecified: Secondary | ICD-10-CM

## 2018-06-13 DIAGNOSIS — F419 Anxiety disorder, unspecified: Secondary | ICD-10-CM

## 2018-06-13 MED ORDER — MIRTAZAPINE 30 MG PO TABS: 30.0000 mg | ORAL_TABLET | Freq: Every day | ORAL | 0 refills | Status: DC

## 2018-06-13 NOTE — Telephone Encounter (Signed)
If she needs Xanax, she will have to be seen by one of the providers face to face.  If not, I can refill the Mirtazapine until she can see Christy.

## 2018-06-16 NOTE — Telephone Encounter (Signed)
Patient is scheduled with new provider

## 2018-06-22 ENCOUNTER — Other Ambulatory Visit: Payer: Self-pay

## 2018-06-22 ENCOUNTER — Ambulatory Visit: Payer: Medicare HMO | Admitting: Family Medicine

## 2018-06-23 ENCOUNTER — Encounter: Payer: Self-pay | Admitting: Family Medicine

## 2018-06-23 ENCOUNTER — Other Ambulatory Visit: Payer: Self-pay

## 2018-06-23 ENCOUNTER — Ambulatory Visit (INDEPENDENT_AMBULATORY_CARE_PROVIDER_SITE_OTHER): Payer: Medicare HMO | Admitting: Family Medicine

## 2018-06-23 VITALS — BP 137/78 | HR 112 | Temp 97.6°F | Ht 63.0 in | Wt 81.6 lb

## 2018-06-23 DIAGNOSIS — M542 Cervicalgia: Secondary | ICD-10-CM | POA: Diagnosis not present

## 2018-06-23 DIAGNOSIS — Z79899 Other long term (current) drug therapy: Secondary | ICD-10-CM | POA: Diagnosis not present

## 2018-06-23 DIAGNOSIS — F32A Depression, unspecified: Secondary | ICD-10-CM

## 2018-06-23 DIAGNOSIS — F419 Anxiety disorder, unspecified: Secondary | ICD-10-CM

## 2018-06-23 DIAGNOSIS — F329 Major depressive disorder, single episode, unspecified: Secondary | ICD-10-CM

## 2018-06-23 DIAGNOSIS — E890 Postprocedural hypothyroidism: Secondary | ICD-10-CM | POA: Diagnosis not present

## 2018-06-23 DIAGNOSIS — Z8639 Personal history of other endocrine, nutritional and metabolic disease: Secondary | ICD-10-CM | POA: Diagnosis not present

## 2018-06-23 DIAGNOSIS — E440 Moderate protein-calorie malnutrition: Secondary | ICD-10-CM

## 2018-06-23 DIAGNOSIS — R69 Illness, unspecified: Secondary | ICD-10-CM | POA: Diagnosis not present

## 2018-06-23 LAB — URINALYSIS
Bilirubin, UA: NEGATIVE
Glucose, UA: NEGATIVE
Ketones, UA: NEGATIVE
Nitrite, UA: NEGATIVE
Specific Gravity, UA: 1.025 (ref 1.005–1.030)
Urobilinogen, Ur: 0.2 mg/dL (ref 0.2–1.0)
pH, UA: 6 (ref 5.0–7.5)

## 2018-06-23 LAB — BAYER DCA HB A1C WAIVED: HB A1C (BAYER DCA - WAIVED): 4.8 % (ref <7.0)

## 2018-06-23 MED ORDER — ESCITALOPRAM OXALATE 20 MG PO TABS: 20.0000 mg | ORAL_TABLET | Freq: Every day | ORAL | 2 refills | Status: DC

## 2018-06-23 MED ORDER — MIRTAZAPINE 30 MG PO TABS: 30.0000 mg | ORAL_TABLET | Freq: Every day | ORAL | 1 refills | Status: DC

## 2018-06-23 MED ORDER — ALPRAZOLAM 0.5 MG PO TABS: 0.5000 mg | ORAL_TABLET | Freq: Two times a day (BID) | ORAL | 2 refills | Status: DC | PRN

## 2018-06-23 MED ORDER — ENSURE PLUS PO LIQD: 237.0000 mL | Freq: Two times a day (BID) | ORAL | 5 refills | Status: DC

## 2018-06-23 NOTE — Progress Notes (Signed)
Subjective: CC: est care new provider, GAD/ Depression, malnutrition, hypothyroidism PCP: Janora Norlander, DO WPY:[id number] L Roberta Brooks is a 53 y.o. female presenting to clinic today for:  1.  Hypothyroidism History: History of Graves' disease status post radioablation.  Significant weight loss over the last several years. Patient reports compliance with Synthroid 75 mcg daily.  She attributes much of her depression and anxiety and weight loss to her ablation for Graves' disease.  Prior to radioablation she notes her weight was 115 pounds and she had no history of mental health issues.  Denies ongoing weight fluctuation, heart palpitations, difficulty swallowing, change in voice. She does report tremors in her hands and cold intolerance.  2.  Anxiety and depression History: Onset after radioablation several years ago. Patient reports that anxiety and depression symptoms are well controlled with mirtazapine 30 mg daily, Lexapro 20 mg daily and Xanax 0.5 mg p.o. twice daily.  She denies any history of hospitalizations for mental health disorder.  No previous suicide attempts but has had suicidal thoughts in the past.  She notes that this is the reason why she resides with her parents now.  She reports that she has good support at home.  She has seen a counselor in the past but did not find them especially helpful.  3.  Malnutrition Several year history of difficulty gaining weight.  Initially thought to be related to underlying depressive disorder but was evaluated by psychiatry several years ago and was told that this was medical in nature.  She is prescribed Ensure twice daily and states that she is not currently able to afford this and therefore has not been taking the Ensure twice daily.  She is working on obtaining Medicaid so that this might be covered, as she has been paying out-of-pocket for these supplements.  She does take a multivitamin daily.  She has had negative cancer screenings  up-to-date.  She is due for annual Pap smear.   ROS: Per HPI  Allergies  Allergen Reactions  . Penicillins Hives   Past Medical History:  Diagnosis Date  . Allergy   . Anxiety   . Depression   . Diverticulosis of colon   . Eye muscle weakness   . Grave's disease   . Unspecified hypothyroidism 09/16/2012    Current Outpatient Medications:  .  ALPRAZolam (XANAX) 0.5 MG tablet, Take 1 tablet (0.5 mg total) by mouth 2 (two) times daily as needed., Disp: 60 tablet, Rfl: 2 .  Ensure Plus (ENSURE PLUS) LIQD, Take 237 mLs by mouth 2 (two) times daily between meals., Disp: 60 Can, Rfl: 5 .  escitalopram (LEXAPRO) 20 MG tablet, Take 1 tablet (20 mg total) by mouth daily., Disp: 90 tablet, Rfl: 2 .  levothyroxine (SYNTHROID, LEVOTHROID) 75 MCG tablet, TAKE 1 TABLET BY MOUTH EVERY DAY, Disp: 90 tablet, Rfl: 1 .  mirtazapine (REMERON) 30 MG tablet, Take 1 tablet (30 mg total) by mouth at bedtime. (Needs to be seen before next refill), Disp: 90 tablet, Rfl: 1 Social History   Socioeconomic History  . Marital status: Divorced    Spouse name: Not on file  . Number of children: 2  . Years of education: 9  . Highest education level: 9th grade  Occupational History  . Occupation: Disabled  Social Needs  . Financial resource strain: Very hard  . Food insecurity:    Worry: Often true    Inability: Often true  . Transportation needs:    Medical: No    Non-medical:  No  Tobacco Use  . Smoking status: Current Every Day Smoker    Packs/day: 0.50    Years: 39.00    Pack years: 19.50    Types: Cigarettes  . Smokeless tobacco: Never Used  . Tobacco comment: Not interested in quitting right now  Substance and Sexual Activity  . Alcohol use: No    Alcohol/week: 0.0 standard drinks  . Drug use: No  . Sexual activity: Not Currently  Lifestyle  . Physical activity:    Days per week: 0 days    Minutes per session: 0 min  . Stress: Very much  Relationships  . Social connections:    Talks  on phone: More than three times a week    Gets together: More than three times a week    Attends religious service: Never    Active member of club or organization: No    Attends meetings of clubs or organizations: Never    Relationship status: Divorced  . Intimate partner violence:    Fear of current or ex partner: No    Emotionally abused: No    Physically abused: No    Forced sexual activity: No  Other Topics Concern  . Not on file  Social History Narrative  . Not on file   Family History  Problem Relation Age of Onset  . Heart disease Mother   . Arthritis Mother   . Dementia Mother   . Graves' disease Father   . Healthy Daughter   . Healthy Son   . Graves' disease Paternal Grandmother   . Clotting disorder Brother   . Heart disease Brother   . Colon cancer Neg Hx   . Colon polyps Neg Hx   . Esophageal cancer Neg Hx   . Rectal cancer Neg Hx   . Stomach cancer Neg Hx     Objective: Office vital signs reviewed. BP 137/78   Pulse (!) 112   Temp 97.6 F (36.4 C) (Oral)   Ht 5' 3"  (1.6 m)   Wt 81 lb 9.6 oz (37 kg)   BMI 14.45 kg/m   Physical Examination:  General: Awake, alert, thin, malnourished, No acute distress HEENT: Normal, sclera white.  No goiter.  No exophthalmos.  Moist mucous membranes Cardio: regular rate and rhythm, S1S2 heard, no murmurs appreciated Pulm: clear to auscultation bilaterally, no wheezes, rhonchi or rales; normal work of breathing on room air Extremities: warm, well perfused, No edema, cyanosis or clubbing; +2 pulses bilaterally MSK: normal gait and station  C-spine: Prominent vertebral spinous processes.  No appreciable increased paraspinal tonicity.  Range of motion is within normal range. Skin: dry; intact; no rashes or lesions; normal temperature Neuro: No tremor noted.  Depression screen [facility] 2/9 06/23/2018 12/05/2017 09/15/2017  Decreased Interest 0 0 0  Down, Depressed, Hopeless 0 0 0  PHQ - 2 Score 0 0 0  Altered sleeping 0 - -   Tired, decreased energy 0 - -  Change in appetite 0 - -  Feeling bad or failure about yourself  0 - -  Trouble concentrating 0 - -  Moving slowly or fidgety/restless 0 - -  Suicidal thoughts 0 - -  PHQ-9 Score 0 - -  Difficult doing work/chores Not difficult at all - -  Some recent data might be hidden   GAD 7 : Generalized Anxiety Score 06/23/2018  Nervous, Anxious, on Edge 0  Control/stop worrying 0  Worry too much - different things 0  Trouble relaxing 0  Restless  0  Easily annoyed or irritable 0  Afraid - awful might happen 0  Total GAD 7 Score 0  Anxiety Difficulty Not difficult at all    Assessment/ Plan: 53 y.o. female   1. Anxiety and depression The Narcotic Database has been reviewed.  There were no red flags.  Last refill of Xanax 05/13/2018.  Controlled substance contract reviewed and signed.  UDS collected.  Continue Remeron, Lexapro, prn Xanax.   - ToxASSURE Select 13 (MW), Urine - ALPRAZolam (XANAX) 0.5 MG tablet; Take 1 tablet (0.5 mg total) by mouth 2 (two) times daily as needed.  Dispense: 60 tablet; Refill: 2 - escitalopram (LEXAPRO) 20 MG tablet; Take 1 tablet (20 mg total) by mouth daily.  Dispense: 90 tablet; Refill: 2 - mirtazapine (REMERON) 30 MG tablet; Take 1 tablet (30 mg total) by mouth at bedtime. (Needs to be seen before next refill)  Dispense: 90 tablet; Refill: 1  2. Moderate protein-calorie malnutrition ([place]) We reviewed calorically dense foods and a handout was provided today.  I briefly discussed consideration for second opinion by psychiatry for ongoing difficulty gaining weight.  At this point, it seems as though malignancy has been ruled out.  Her weight is up a pound since her last visit.  I have prescribed the Ensure again.  Her urinalysis today did demonstrate protein which is concerning for renal disease.  I do wonder if her use of oral NSAIDs daily is causing this versus related to malnutrition.  A1c was within normal range.  Recheck CMP.    - CMP14+EGFR - CBC - Urinalysis - Bayer DCA Hb A1c Waived - Ensure Plus (ENSURE PLUS) LIQD; Take 237 mLs by mouth 2 (two) times daily between meals.  Dispense: 60 Can; Refill: 5 - mirtazapine (REMERON) 30 MG tablet; Take 1 tablet (30 mg total) by mouth at bedtime. (Needs to be seen before next refill)  Dispense: 90 tablet; Refill: 1  3. Postablative hypothyroidism Somewhat symptomatic with cold intolerance and reported tremors.  Check thyroid panel.  Refills of thyroid medicine pending this result - Thyroid Panel With TSH  4. Controlled substance agreement signed - ToxASSURE Select 13 (MW), Urine  5. Neck pain We discussed avoiding use of NSAID medications, particularly given concomitant use of SSRIs.  Recommended using topical capsaicin.  Okay to use scheduled Tylenol arthritis.  If persistently an issue, low threshold to obtain x-rays.  Need to rule out any osteoporotic fractures possibly contributing given malnutrition status.  May need to consider also obtaining early DEXA given low weight.    Orders Placed This Encounter  Procedures  . CMP14+EGFR  . Thyroid Panel With TSH  . CBC  . ToxASSURE Select 13 (MW), Urine  . Urinalysis  . Bayer DCA Hb A1c Waived   Meds ordered this encounter  Medications  . ALPRAZolam (XANAX) 0.5 MG tablet    Sig: Take 1 tablet (0.5 mg total) by mouth 2 (two) times daily as needed.    Dispense:  60 tablet    Refill:  2    Fill 30d after last refill  . Ensure Plus (ENSURE PLUS) LIQD    Sig: Take 237 mLs by mouth 2 (two) times daily between meals.    Dispense:  60 Can    Refill:  5    Underweight (R63.6)  . escitalopram (LEXAPRO) 20 MG tablet    Sig: Take 1 tablet (20 mg total) by mouth daily.    Dispense:  90 tablet    Refill:  2  .  mirtazapine (REMERON) 30 MG tablet    Sig: Take 1 tablet (30 mg total) by mouth at bedtime. (Needs to be seen before next refill)    Dispense:  90 tablet    Refill:  [place], DO [place] [phone]

## 2018-06-23 NOTE — Patient Instructions (Signed)
Do not use ibuprofen, Aleve, naproxen, Advil or Motrin.  These can impact your kidney function and also caused stomach bleeds, particularly if you are using Lexapro and mirtazapine.  We discussed for your neck you can use capsaicin cream.  This is available over-the-counter.  I also recommend that you use Tylenol arthritis, you can use this 3 times a day if needed for pain.  I have sent a new prescription for Ensure.  We discussed the importance of maintaining a healthy weight.  We also discussed consideration for referral to psychiatry again to see if perhaps we can help you with weight from that standpoint as well.  You had labs performed today.  You will be contacted with the results of the labs once they are available, usually in the next 3 business days for routine lab work.  If you had a pap smear or biopsy performed, expect to be contacted in about 7-10 days.

## 2018-06-24 LAB — CMP14+EGFR
ALT: 15 IU/L (ref 0–32)
AST: 21 IU/L (ref 0–40)
Albumin/Globulin Ratio: 2.2 (ref 1.2–2.2)
Albumin: 4.9 g/dL (ref 3.8–4.9)
Alkaline Phosphatase: 58 IU/L (ref 39–117)
BUN/Creatinine Ratio: 29 — ABNORMAL HIGH (ref 9–23)
BUN: 32 mg/dL — ABNORMAL HIGH (ref 6–24)
Bilirubin Total: 0.4 mg/dL (ref 0.0–1.2)
CO2: 20 mmol/L (ref 20–29)
Calcium: 9.9 mg/dL (ref 8.7–10.2)
Chloride: 103 mmol/L (ref 96–106)
Creatinine, Ser: 1.12 mg/dL — ABNORMAL HIGH (ref 0.57–1.00)
GFR calc Af Amer: 64 mL/min/{1.73_m2} (ref >59)
GFR calc non Af Amer: 56 mL/min/{1.73_m2} — ABNORMAL LOW (ref >59)
Globulin, Total: 2.2 g/dL (ref 1.5–4.5)
Glucose: 87 mg/dL (ref 65–99)
Potassium: 4 mmol/L (ref 3.5–5.2)
Sodium: 142 mmol/L (ref 134–144)
Total Protein: 7.1 g/dL (ref 6.0–8.5)

## 2018-06-24 LAB — CBC
Hematocrit: 44.5 % (ref 34.0–46.6)
Hemoglobin: 15.4 g/dL (ref 11.1–15.9)
MCH: 33.1 pg — ABNORMAL HIGH (ref 26.6–33.0)
MCHC: 34.6 g/dL (ref 31.5–35.7)
MCV: 96 fL (ref 79–97)
Platelets: 205 10*3/uL (ref 150–450)
RBC: 4.65 x10E6/uL (ref 3.77–5.28)
RDW: 11.9 % (ref 11.7–15.4)
WBC: 7.7 10*3/uL (ref 3.4–10.8)

## 2018-06-24 LAB — THYROID PANEL WITH TSH
Free Thyroxine Index: 2.7 (ref 1.2–4.9)
T3 Uptake Ratio: 26 % (ref 24–39)
T4, Total: 10.5 ug/dL (ref 4.5–12.0)
TSH: 0.314 u[IU]/mL — ABNORMAL LOW (ref 0.450–4.500)

## 2018-06-25 LAB — TOXASSURE SELECT 13 (MW), URINE

## 2018-06-26 ENCOUNTER — Other Ambulatory Visit: Payer: Self-pay | Admitting: Family Medicine

## 2018-06-26 DIAGNOSIS — E039 Hypothyroidism, unspecified: Secondary | ICD-10-CM

## 2018-06-26 MED ORDER — LEVOTHYROXINE SODIUM 75 MCG PO TABS: 37.5000 ug | ORAL_TABLET | Freq: Every day | ORAL | 1 refills | Status: DC

## 2018-08-07 ENCOUNTER — Telehealth: Payer: Self-pay | Admitting: Family Medicine

## 2018-08-24 ENCOUNTER — Encounter: Payer: Self-pay | Admitting: *Deleted

## 2018-08-24 ENCOUNTER — Other Ambulatory Visit: Payer: Self-pay

## 2018-08-24 ENCOUNTER — Ambulatory Visit (INDEPENDENT_AMBULATORY_CARE_PROVIDER_SITE_OTHER): Payer: Medicare HMO | Admitting: *Deleted

## 2018-08-24 DIAGNOSIS — Z0000 Encounter for general adult medical examination without abnormal findings: Secondary | ICD-10-CM | POA: Diagnosis not present

## 2018-08-24 NOTE — Progress Notes (Signed)
MEDICARE ANNUAL WELLNESS VISIT  08/24/2018  Telephone Visit Disclaimer This Medicare AWV was conducted by telephone due to national recommendations for restrictions regarding the COVID-19 Pandemic (e.g. social distancing).  I verified, using two identifiers, that I am speaking with Roberta Brooks or their authorized healthcare agent. I discussed the limitations, risks, security, and privacy concerns of performing an evaluation and management service by telephone and the potential availability of an in-person appointment in the future. The patient expressed understanding and agreed to proceed.   Subjective:  Roberta Brooks is a 53 y.o. female patient of Janora Norlander, DO who had a Medicare Annual Wellness Visit today via telephone. Roberta Brooks is Legally disabled and lives with their family. she has 2 children. she reports that she is socially active and does interact with friends/family regularly. she is not physically active and enjoys playing computer games.  Patient Care Team: Janora Norlander, DO as PCP - General (Family Medicine) Arlana Lindau, RD as Dietitian (Nutrition)  Advanced Directives 08/24/2018 08/20/2017 07/26/2016 06/12/2016 04/13/2015 01/26/2015 12/14/2014  Does Patient Have a Medical Advance Directive? No No No No No No No  Would patient like information on creating a medical advance directive? No - Patient declined No - Patient declined Yes (MAU/Ambulatory/Procedural Areas - Information given) - No - patient declined information - -    Hospital Utilization Over the Past 12 Months: # of hospitalizations or ER visits: 0 # of surgeries: 2  Review of Systems    Patient reports that her overall health is unchanged compared to last year.  Patient Reported Readings (BP, Pulse, CBG, Weight, etc) none  Review of Systems: No complaints  All other systems negative.  Pain Assessment Pain : No/denies pain     Current Medications & Allergies (verified) Allergies as of  08/24/2018      Reactions   Penicillins Hives      Medication List       Accurate as of August 24, 2018  2:43 PM. If you have any questions, ask your nurse or doctor.        ALPRAZolam 0.5 MG tablet Commonly known as: XANAX Take 1 tablet (0.5 mg total) by mouth 2 (two) times daily as needed.   Ensure Plus Liqd Take 237 mLs by mouth 2 (two) times daily between meals.   escitalopram 20 MG tablet Commonly known as: LEXAPRO Take 1 tablet (20 mg total) by mouth daily.   levothyroxine 75 MCG tablet Commonly known as: SYNTHROID Take 0.5 tablets (37.5 mcg total) by mouth daily.   mirtazapine 30 MG tablet Commonly known as: REMERON Take 1 tablet (30 mg total) by mouth at bedtime. (Needs to be seen before next refill)       History (reviewed): Past Medical History:  Diagnosis Date  . Allergy   . Anxiety   . Depression   . Diverticulosis of colon   . Eye muscle weakness   . Grave's disease   . Unspecified hypothyroidism 09/16/2012   Past Surgical History:  Procedure Laterality Date  . CERVIX LESION DESTRUCTION    . COLONOSCOPY    . DENTAL SURGERY  07/18/2016  . ELBOW SURGERY    . EYE MUSCLE SURGERY    . HAND SURGERY    . TONSILLECTOMY AND ADENOIDECTOMY     Family History  Problem Relation Age of Onset  . Heart disease Mother   . Arthritis Mother   . Dementia Mother   . Graves' disease Father   .  Healthy Daughter   . Healthy Son   . Graves' disease Paternal Grandmother   . Clotting disorder Brother   . Heart disease Brother   . Colon cancer Neg Hx   . Colon polyps Neg Hx   . Esophageal cancer Neg Hx   . Rectal cancer Neg Hx   . Stomach cancer Neg Hx    Social History   Socioeconomic History  . Marital status: Divorced    Spouse name: Not on file  . Number of children: 2  . Years of education: 9  . Highest education level: 9th grade  Occupational History  . Occupation: Disabled  Social Needs  . Financial resource strain: Hard  . Food insecurity     Worry: Often true    Inability: Often true  . Transportation needs    Medical: No    Non-medical: No  Tobacco Use  . Smoking status: Current Every Day Smoker    Packs/day: 0.50    Years: 39.00    Pack years: 19.50    Types: Cigarettes  . Smokeless tobacco: Never Used  . Tobacco comment: Not interested in quitting right now  Substance and Sexual Activity  . Alcohol use: No    Alcohol/week: 0.0 standard drinks  . Drug use: No  . Sexual activity: Not Currently  Lifestyle  . Physical activity    Days per week: 0 days    Minutes per session: 0 min  . Stress: Very much  Relationships  . Social connections    Talks on phone: More than three times a week    Gets together: More than three times a week    Attends religious service: Never    Active member of club or organization: No    Attends meetings of clubs or organizations: Never    Relationship status: Divorced  Other Topics Concern  . Not on file  Social History Narrative  . Not on file    Activities of Daily Living In your present state of health, do you have any difficulty performing the following activities: 08/24/2018  Hearing? N  Vision? N  Difficulty concentrating or making decisions? N  Walking or climbing stairs? N  Dressing or bathing? N  Doing errands, shopping? N  Preparing Food and eating ? N  Using the Toilet? N  In the past six months, have you accidently leaked urine? N  Do you have problems with loss of bowel control? N  Managing your Medications? N  Managing your Finances? N  Housekeeping or managing your Housekeeping? N  Some recent data might be hidden    Patient Literacy How often do you need to have someone help you when you read instructions, pamphlets, or other written materials from your doctor or pharmacy?: 1 - Never What is the last grade level you completed in school?: 9th grade  Exercise Current Exercise Habits: The patient does not participate in regular exercise at present, Exercise  limited by: None identified  Diet Patient reports consuming 1 meals a day and 5-6 snack(s) a day Patient reports that her primary diet is: Regular Patient reports that she does have regular access to food.   Depression Screen PHQ 2/9 Scores 08/24/2018 06/23/2018 06/23/2018 12/05/2017 09/15/2017 08/20/2017 07/14/2017  PHQ - 2 Score 0 0 0 0 0 0 0  PHQ- 9 Score - 0 0 - - - -     Fall Risk Fall Risk  08/24/2018 12/05/2017 08/20/2017 07/14/2017 06/02/2017  Falls in the past year? 1 No No  No No  Number falls in past yr: 0 - - - -  Injury with Fall? 0 - - - -     Objective:  Roberta Brooks seemed alert and oriented and she participated appropriately during our telephone visit.  Blood Pressure Weight BMI  BP Readings from Last 3 Encounters:  06/23/18 137/78  12/05/17 132/78  09/15/17 (!) 158/72   Wt Readings from Last 3 Encounters:  06/23/18 81 lb 9.6 oz (37 kg)  12/05/17 80 lb 6.4 oz (36.5 kg)  09/15/17 77 lb 9.6 oz (35.2 kg)   BMI Readings from Last 1 Encounters:  06/23/18 14.45 kg/m    *Unable to obtain current vital signs, weight, and BMI due to telephone visit type  Hearing/Vision  . Roberta Brooks did not seem to have difficulty with hearing/understanding during the telephone conversation . Reports that she has not had a formal eye exam by an eye care professional within the past year . Reports that she has not had a formal hearing evaluation within the past year *Unable to fully assess hearing and vision during telephone visit type  Cognitive Function: 6CIT Screen 08/24/2018  What Year? 0 points  What month? 0 points  What time? 0 points  Count back from 20 0 points  Months in reverse 0 points  Repeat phrase 2 points  Total Score 2   (Normal:0-7, Significant for Dysfunction: >8)  Normal Cognitive Function Screening: Yes   Immunization & Health Maintenance Record Immunization History  Administered Date(s) Administered  . DTaP 12/12/1966, 07/03/1970, 05/14/1971  . IPV 12/12/1966,  07/03/1970, 05/14/1971  . Influenza,inj,Quad PF,6+ Mos 01/20/2013, 12/26/2014, 12/07/2015, 12/05/2017  . Influenza-Unspecified 12/26/2014, 12/07/2015, 12/05/2017  . Measles 05/05/1970  . Rubella 10/25/1969, 05/05/1970  . Smallpox 12/12/1966  . Td 04/26/1977    Health Maintenance  Topic Date Due  . TETANUS/TDAP  04/27/1987  . PAP SMEAR-Modifier  11/14/2017  . MAMMOGRAM  07/29/2018  . INFLUENZA VACCINE  09/26/2018  . COLONOSCOPY  01/25/2025  . HIV Screening  Completed       Assessment  This is a routine wellness examination for Roberta Brooks.  Health Maintenance: Due or Overdue Health Maintenance Due  Topic Date Due  . TETANUS/TDAP  04/27/1987  . PAP SMEAR-Modifier  11/14/2017  . MAMMOGRAM  07/29/2018    Roberta Brooks does not need a referral for Community Assistance: Care Management:   no Social Work:    no Prescription Assistance:  no Nutrition/Diabetes Education:  no   Plan:  Personalized Goals Goals Addressed            This Visit's Progress   . DIET - DECREASE SODA OR JUICE INTAKE (pt-stated)       Pt states she drinks 1/2 gallon to 1 gallon of sweet tea a day and also drinks about 1 liter of Sprite daily. States she doesn't like water.      Personalized Health Maintenance & Screening Recommendations  Td vaccine Shingles vaccine  Lung Cancer Screening Recommended: no (Low Dose CT Chest recommended if Age 53-80 years, 30 pack-year currently smoking OR have quit w/in past 15 years) Hepatitis C Screening recommended: no HIV Screening recommended: no  Advanced Directives: Written information was not prepared per patient's request.  Referrals & Orders No orders of the defined types were placed in this encounter.   Follow-up Plan . Follow-up with Janora Norlander, DO as planned . Consider TDAP and Shingles vaccines at your next visit with your PCP.   I have personally  reviewed and noted the following in the patient's chart:   . Medical and social  history . Use of alcohol, tobacco or illicit drugs  . Current medications and supplements . Functional ability and status . Nutritional status . Physical activity . Advanced directives . List of other physicians . Hospitalizations, surgeries, and ER visits in previous 12 months . Vitals . Screenings to include cognitive, depression, and falls . Referrals and appointments  In addition, I have reviewed and discussed with Roberta Brooks certain preventive protocols, quality metrics, and best practice recommendations. A written personalized care plan for preventive services as well as general preventive health recommendations is available and can be mailed to the patient at her request.      Otilio Connors G,LPN  05/18/5571

## 2018-08-24 NOTE — Patient Instructions (Signed)

## 2018-11-24 ENCOUNTER — Other Ambulatory Visit: Payer: Self-pay | Admitting: Family Medicine

## 2018-11-24 DIAGNOSIS — F329 Major depressive disorder, single episode, unspecified: Secondary | ICD-10-CM

## 2018-11-24 DIAGNOSIS — F32A Depression, unspecified: Secondary | ICD-10-CM

## 2018-11-27 ENCOUNTER — Other Ambulatory Visit: Payer: Self-pay | Admitting: Family Medicine

## 2018-11-27 DIAGNOSIS — F32A Depression, unspecified: Secondary | ICD-10-CM

## 2018-11-27 DIAGNOSIS — F329 Major depressive disorder, single episode, unspecified: Secondary | ICD-10-CM

## 2018-11-27 DIAGNOSIS — F419 Anxiety disorder, unspecified: Secondary | ICD-10-CM

## 2018-12-01 ENCOUNTER — Other Ambulatory Visit: Payer: Self-pay | Admitting: Family Medicine

## 2018-12-01 DIAGNOSIS — F32A Depression, unspecified: Secondary | ICD-10-CM

## 2018-12-01 DIAGNOSIS — F329 Major depressive disorder, single episode, unspecified: Secondary | ICD-10-CM

## 2018-12-02 ENCOUNTER — Ambulatory Visit (INDEPENDENT_AMBULATORY_CARE_PROVIDER_SITE_OTHER): Payer: Medicare HMO | Admitting: Family Medicine

## 2018-12-02 ENCOUNTER — Encounter: Payer: Self-pay | Admitting: Family Medicine

## 2018-12-02 ENCOUNTER — Telehealth: Payer: Self-pay | Admitting: Family Medicine

## 2018-12-02 DIAGNOSIS — F419 Anxiety disorder, unspecified: Secondary | ICD-10-CM

## 2018-12-02 DIAGNOSIS — R69 Illness, unspecified: Secondary | ICD-10-CM | POA: Diagnosis not present

## 2018-12-02 DIAGNOSIS — F32A Depression, unspecified: Secondary | ICD-10-CM

## 2018-12-02 DIAGNOSIS — F329 Major depressive disorder, single episode, unspecified: Secondary | ICD-10-CM

## 2018-12-02 MED ORDER — ALPRAZOLAM 0.5 MG PO TABS: 0.5000 mg | ORAL_TABLET | Freq: Two times a day (BID) | ORAL | 0 refills | Status: DC | PRN

## 2018-12-02 NOTE — Telephone Encounter (Signed)
Scheduled telephone visit for today for patient and patient notified

## 2018-12-02 NOTE — Telephone Encounter (Signed)
First available was 10/13. Would you like me to work her in sooner or can she wait?

## 2018-12-02 NOTE — Progress Notes (Signed)
Telephone visit  Subjective: CC: anxiety PCP: Janora Norlander, DO [id number] Roberta Brooks is a 53 y.o. female calls for telephone consult today. Patient provides verbal consent for consult held via phone because patient has cold symptoms.  Location of patient: home Location of provider: [facility] Others present for call: none  1. Anxiety She is compliant with remeron 30mg  daily, lexapro 20mg  daily and xanax 0.5mg  1-2 times per day.  She notes she has gained weight since our last visit.  No falls, dizziness, difficulty breathing.     ROS: Per HPI  Allergies  Allergen Reactions  . Penicillins Hives   Past Medical History:  Diagnosis Date  . Allergy   . Anxiety   . Depression   . Diverticulosis of colon   . Eye muscle weakness   . Grave's disease   . Unspecified hypothyroidism 09/16/2012    Current Outpatient Medications:  .  ALPRAZolam (XANAX) 0.5 MG tablet, Take 1 tablet (0.5 mg total) by mouth 2 (two) times daily as needed., Disp: 60 tablet, Rfl: 2 .  Ensure Plus (ENSURE PLUS) LIQD, Take 237 mLs by mouth 2 (two) times daily between meals., Disp: 60 Can, Rfl: 5 .  escitalopram (LEXAPRO) 20 MG tablet, Take 1 tablet (20 mg total) by mouth daily., Disp: 90 tablet, Rfl: 2 .  levothyroxine (SYNTHROID) 75 MCG tablet, Take 0.5 tablets (37.5 mcg total) by mouth daily., Disp: 90 tablet, Rfl: 1 .  mirtazapine (REMERON) 30 MG tablet, Take 1 tablet (30 mg total) by mouth at bedtime. (Needs to be seen before next refill), Disp: 90 tablet, Rfl: 1  Assessment/ Plan: 53 y.o. female   1. Anxiety and depression Stable.  Will schedule for follow up in Nov on 11/3 at 4pm for physical with labs and anxiety recheck. The Narcotic Database has been reviewed.  There were no red flags.   - ALPRAZolam (XANAX) 0.5 MG tablet; Take 1 tablet (0.5 mg total) by mouth 2 (two) times daily as needed.  Dispense: 60 tablet; Refill: 0   Start time: 5:02pm End time: 5:12pm  Total time spent on patient care  (including telephone call/ virtual visit): 15 minutes  [place], [place] [phone]

## 2018-12-02 NOTE — Telephone Encounter (Signed)
Ok to put on a televisit for today.  Please also make sure she has an IN OFFICE follow up in the next month

## 2018-12-08 ENCOUNTER — Ambulatory Visit: Payer: Medicare HMO | Admitting: Family Medicine

## 2019-01-04 ENCOUNTER — Other Ambulatory Visit: Payer: Self-pay | Admitting: Family Medicine

## 2019-01-04 DIAGNOSIS — F419 Anxiety disorder, unspecified: Secondary | ICD-10-CM

## 2019-01-04 DIAGNOSIS — E440 Moderate protein-calorie malnutrition: Secondary | ICD-10-CM

## 2019-01-04 DIAGNOSIS — F32A Depression, unspecified: Secondary | ICD-10-CM

## 2019-01-04 DIAGNOSIS — F329 Major depressive disorder, single episode, unspecified: Secondary | ICD-10-CM

## 2019-01-13 ENCOUNTER — Ambulatory Visit (INDEPENDENT_AMBULATORY_CARE_PROVIDER_SITE_OTHER): Payer: Medicare HMO | Admitting: Family Medicine

## 2019-01-13 VITALS — Wt 103.6 lb

## 2019-01-13 DIAGNOSIS — R636 Underweight: Secondary | ICD-10-CM | POA: Diagnosis not present

## 2019-01-13 DIAGNOSIS — E890 Postprocedural hypothyroidism: Secondary | ICD-10-CM | POA: Diagnosis not present

## 2019-01-13 DIAGNOSIS — F32A Depression, unspecified: Secondary | ICD-10-CM

## 2019-01-13 DIAGNOSIS — R69 Illness, unspecified: Secondary | ICD-10-CM | POA: Diagnosis not present

## 2019-01-13 DIAGNOSIS — F329 Major depressive disorder, single episode, unspecified: Secondary | ICD-10-CM

## 2019-01-13 DIAGNOSIS — F419 Anxiety disorder, unspecified: Secondary | ICD-10-CM | POA: Diagnosis not present

## 2019-01-13 MED ORDER — ALPRAZOLAM 0.5 MG PO TABS: 0.5000 mg | ORAL_TABLET | Freq: Two times a day (BID) | ORAL | 0 refills | Status: DC | PRN

## 2019-01-13 NOTE — Progress Notes (Signed)
Telephone visit  Subjective: CC: Follow-up anxiety depression PCP: Janora Norlander, DO [id number] Roberta Brooks is a 53 y.o. female calls for telephone consult today. Patient provides verbal consent for consult held via phone.  Location of patient: home Location of provider: Working remotely from home Others present for call: none  1.  Anxiety and depression Patient reports compliance with Lexapro, mirtazapine and alprazolam.  She is using her alprazolam fairly consistently with 1 tablet around 2 PM every day and the other tablet at nighttime with her mirtazapine.  She reports good rest.  Denies any tremor, falls, change in memory or dizziness.  No respiratory depression.  Currently having some URI symptoms that have been ongoing for several days now and diarrhea that onset yesterday.  She is adamant that she does not have COVID-19 and declines any testing.  2.  Hypothyroidism Patient reports that she is gaining weight which she is thrilled about.  Appetite is good.  Reports her weight to be 103.6 pounds today.  She continues to take the half dose of Synthroid daily.   ROS: Per HPI  Allergies  Allergen Reactions  . Penicillins Hives   Past Medical History:  Diagnosis Date  . Allergy   . Anxiety   . Depression   . Diverticulosis of colon   . Eye muscle weakness   . Grave's disease   . Unspecified hypothyroidism 09/16/2012    Current Outpatient Medications:  .  ALPRAZolam (XANAX) 0.5 MG tablet, Take 1 tablet (0.5 mg total) by mouth 2 (two) times daily as needed. Must have in office OV before further fills, Disp: 60 tablet, Rfl: 0 .  Ensure Plus (ENSURE PLUS) LIQD, Take 237 mLs by mouth 2 (two) times daily between meals., Disp: 60 Can, Rfl: 5 .  escitalopram (LEXAPRO) 20 MG tablet, Take 1 tablet (20 mg total) by mouth daily., Disp: 90 tablet, Rfl: 2 .  levothyroxine (SYNTHROID) 75 MCG tablet, Take 0.5 tablets (37.5 mcg total) by mouth daily., Disp: 90 tablet, Rfl: 1 .  mirtazapine  (REMERON) 30 MG tablet, Take 1 tablet (30 mg total) by mouth at bedtime., Disp: 90 tablet, Rfl: 1  Assessment/ Plan: 53 y.o. female   1. Anxiety and depression Continue mirtazapine, Lexapro given stability and lack of concerning symptoms for serotonin syndrome.  She currently is unable to come into the office due to infectious symptoms.  We discussed that this would be the last refill that she could get without being seen face-to-face since it has been a while since we seen her in the office.  We need to obtain labs as below.  She voiced good understanding and will schedule an appointment in office as soon as possible after her symptoms have resolved. The Narcotic Database has been reviewed.  There were no red flags.    - ALPRAZolam (XANAX) 0.5 MG tablet; Take 1 tablet (0.5 mg total) by mouth 2 (two) times daily as needed. Must have in office OV before further fills  Dispense: 60 tablet; Refill: 0  2. Postablative hypothyroidism Needs repeat thyroid testing as her TSH was noted to be suppressed and we subsequently reduced her Synthroid dose by half.  She has since been gaining weight which is reassuring.  3. Underweight Gaining weight.   Start time: 2:04pm End time: 2:14pm  Total time spent on patient care (including telephone call/ virtual visit): 15 minutes  [place], [place] [phone]

## 2019-02-09 ENCOUNTER — Other Ambulatory Visit: Payer: Self-pay | Admitting: Family Medicine

## 2019-02-09 DIAGNOSIS — E039 Hypothyroidism, unspecified: Secondary | ICD-10-CM

## 2019-03-08 ENCOUNTER — Encounter: Payer: Self-pay | Admitting: Family Medicine

## 2019-03-08 ENCOUNTER — Other Ambulatory Visit: Payer: Self-pay | Admitting: Family Medicine

## 2019-03-08 DIAGNOSIS — F32A Depression, unspecified: Secondary | ICD-10-CM

## 2019-03-08 DIAGNOSIS — F329 Major depressive disorder, single episode, unspecified: Secondary | ICD-10-CM

## 2019-03-10 ENCOUNTER — Ambulatory Visit (INDEPENDENT_AMBULATORY_CARE_PROVIDER_SITE_OTHER): Payer: Medicare HMO | Admitting: Family Medicine

## 2019-03-10 DIAGNOSIS — F419 Anxiety disorder, unspecified: Secondary | ICD-10-CM

## 2019-03-10 DIAGNOSIS — H539 Unspecified visual disturbance: Secondary | ICD-10-CM | POA: Diagnosis not present

## 2019-03-10 DIAGNOSIS — F329 Major depressive disorder, single episode, unspecified: Secondary | ICD-10-CM

## 2019-03-10 DIAGNOSIS — J069 Acute upper respiratory infection, unspecified: Secondary | ICD-10-CM

## 2019-03-10 DIAGNOSIS — F32A Depression, unspecified: Secondary | ICD-10-CM

## 2019-03-10 DIAGNOSIS — E890 Postprocedural hypothyroidism: Secondary | ICD-10-CM

## 2019-03-10 DIAGNOSIS — R69 Illness, unspecified: Secondary | ICD-10-CM | POA: Diagnosis not present

## 2019-03-10 MED ORDER — AZELASTINE HCL 0.1 % NA SOLN: 1.0000 | Freq: Two times a day (BID) | NASAL | 12 refills | Status: DC

## 2019-03-10 MED ORDER — CETIRIZINE HCL 10 MG PO TABS: 10.0000 mg | ORAL_TABLET | Freq: Every day | ORAL | 11 refills | Status: DC

## 2019-03-10 MED ORDER — ALPRAZOLAM 0.5 MG PO TABS: 0.5000 mg | ORAL_TABLET | Freq: Two times a day (BID) | ORAL | 0 refills | Status: DC | PRN

## 2019-03-10 MED ORDER — AZITHROMYCIN 250 MG PO TABS: ORAL_TABLET | ORAL | 0 refills | Status: DC

## 2019-03-10 NOTE — Patient Instructions (Signed)
Stress, Adult Stress is a normal reaction to life events. Stress is what you feel when life demands more than you are used to, or more than you think you can handle. Some stress can be useful, such as studying for a test or meeting a deadline at work. Stress that occurs too often or for too long can cause problems. It can affect your emotional health and interfere with relationships and normal daily activities. Too much stress can weaken your body's defense system (immune system) and increase your risk for physical illness. If you already have a medical problem, stress can make it worse. What are the causes? All sorts of life events can cause stress. An event that causes stress for one person may not be stressful for another person. Major life events, whether positive or negative, commonly cause stress. Examples include:  Losing a job or starting a new job.  Losing a loved one.  Moving to a new town or home.  Getting married or divorced.  Having a baby.  Getting injured or sick. Less obvious life events can also cause stress, especially if they occur day after day or in combination with each other. Examples include:  Working long hours.  Driving in traffic.  Caring for children.  Being in debt.  Being in a difficult relationship. What are the signs or symptoms? Stress can cause emotional symptoms, including:  Anxiety. This is feeling worried, afraid, on edge, overwhelmed, or out of control.  Anger, including irritation or impatience.  Depression. This is feeling sad, down, helpless, or guilty.  Trouble focusing, remembering, or making decisions. Stress can cause physical symptoms, including:  Aches and pains. These may affect your head, neck, back, stomach, or other areas of your body.  Tight muscles or a clenched jaw.  Low energy.  Trouble sleeping. Stress can cause unhealthy behaviors, including:  Eating to feel better (overeating) or skipping meals.  Working too  much or putting off tasks.  Smoking, drinking alcohol, or using drugs to feel better. How is this diagnosed? Stress is diagnosed through an assessment by your health care provider. He or she may diagnose this condition based on:  Your symptoms and any stressful life events.  Your medical history.  Tests to rule out other causes of your symptoms. Depending on your condition, your health care provider may refer you to a specialist for further evaluation. How is this treated?  Stress management techniques are the recommended treatment for stress. Medicine is not typically recommended for the treatment of stress. Techniques to reduce your reaction to stressful life events include:  Stress identification. Monitor yourself for symptoms of stress and identify what causes stress for you. These skills may help you to avoid or prepare for stressful events.  Time management. Set your priorities, keep a calendar of events, and learn to say no. Taking these actions can help you avoid making too many commitments. Techniques for coping with stress include:  Rethinking the problem. Try to think realistically about stressful events rather than ignoring them or overreacting. Try to find the positives in a stressful situation rather than focusing on the negatives.  Exercise. Physical exercise can release both physical and emotional tension. The key is to find a form of exercise that you enjoy and do it regularly.  Relaxation techniques. These relax the body and mind. The key is to find one or more that you enjoy and use the techniques regularly. Examples include: ? Meditation, deep breathing, or progressive relaxation techniques. ? Yoga or   tai chi. ? Biofeedback, mindfulness techniques, or journaling. ? Listening to music, being out in nature, or participating in other hobbies.  Practicing a healthy lifestyle. Eat a balanced diet, drink plenty of water, limit or avoid caffeine, and get plenty of  sleep.  Having a strong support network. Spend time with family, friends, or other people you enjoy being around. Express your feelings and talk things over with someone you trust. Counseling or talk therapy with a mental health professional may be helpful if you are having trouble managing stress on your own. Follow these instructions at home: Lifestyle   Avoid drugs.  Do not use any products that contain nicotine or tobacco, such as cigarettes, e-cigarettes, and chewing tobacco. If you need help quitting, ask your health care provider.  Limit alcohol intake to no more than 1 drink a day for nonpregnant women and 2 drinks a day for men. One drink equals 12 oz of beer, 5 oz of wine, or 1 oz of hard liquor  Do not use alcohol or drugs to relax.  Eat a balanced diet that includes fresh fruits and vegetables, whole grains, lean meats, fish, eggs, and beans, and low-fat dairy. Avoid processed foods and foods high in added fat, sugar, and salt.  Exercise at least 30 minutes on 5 or more days each week.  Get 7-8 hours of sleep each night. General instructions   Practice stress management techniques as discussed with your health care provider.  Drink enough fluid to keep your urine clear or pale yellow.  Take over-the-counter and prescription medicines only as told by your health care provider.  Keep all follow-up visits as told by your health care provider. This is important. Contact a health care provider if:  Your symptoms get worse.  You have new symptoms.  You feel overwhelmed by your problems and can no longer manage them on your own. Get help right away if:  You have thoughts of hurting yourself or others. If you ever feel like you may hurt yourself or others, or have thoughts about taking your own life, get help right away. You can go to your nearest emergency department or call:  Your local emergency services (911 in the [place]  A suicide crisis helpline, such as the  [place] at (316) 250-6172. This is open 24 hours a day. Summary  Stress is a normal reaction to life events. It can cause problems if it happens too often or for too long.  Practicing stress management techniques is the best way to treat stress.  Counseling or talk therapy with a mental health professional may be helpful if you are having trouble managing stress on your own. This information is not intended to replace advice given to you by your health care provider. Make sure you discuss any questions you have with your health care provider. Document Revised: 09/11/2018 Document Reviewed: 04/03/2016 Elsevier Patient Education  [place].

## 2019-03-10 NOTE — Progress Notes (Signed)
Telephone visit  Subjective: CC: f/u anxiety PCP: Janora Norlander, DO [id number] Roberta Brooks is a 54 y.o. female calls for telephone consult today. Patient provides verbal consent for consult held via phone.  Due to COVID-19 pandemic this visit was conducted virtually. This visit type was conducted due to national recommendations for restrictions regarding the COVID-19 Pandemic (e.g. social distancing, sheltering in place) in an effort to limit this patient's exposure and mitigate transmission in our community. All issues noted in this document were discussed and addressed.  A physical exam was not performed with this format.   Location of patient: home Location of provider: Working remotely from home Others present for call: none  1. Anxiety Patient reports some increased anxiety and worrying, particularly surrounding election results.  She notes she is had difficulty getting a Christmas present from [facility] for her daughter as well.  Stuck at the post office in [place].  She grinds her teeth quite a bit.  She takes her Lexapro every morning and her mirtazapine every evening.  Typically she only takes the alprazolam at nighttime but sometimes does need it during the day to calm herself down.  Denies any falls, respiratory depression, memory changes or confusion.  2. Visual disturbance Patient reports that she had some blurred vision with spots in her vision a few days ago and this resolved independently.  She was worried that she has developed thyroid eye disease.  She has history of Grave's disease s/p ablation of the thyroid.  She is on thyroid replacement 35 mcg/ d.  Does not report any ocular pain  3. Cold symptoms She has had rhinorrhea, watery eyes, tickle in throat.  Symptoms are worse at night time.  She has been having these symptoms for well over a month now.  She has not gotten tested for COVID-19.  Does not report any fevers.  No myalgia or malaise.  She has used Nyquil, OTC  cold/ sinus meds, Tylenol, OTC allergy medication.    She has not been on any antibiotics.  Symptoms have not really responded to OTC meds.   ROS: Per HPI  Allergies  Allergen Reactions  . Penicillins Hives   Past Medical History:  Diagnosis Date  . Allergy   . Anxiety   . Depression   . Diverticulosis of colon   . Eye muscle weakness   . Grave's disease   . Unspecified hypothyroidism 09/16/2012    Current Outpatient Medications:  .  ALPRAZolam (XANAX) 0.5 MG tablet, Take 1 tablet (0.5 mg total) by mouth 2 (two) times daily as needed. Must have in office OV before further fills, Disp: 60 tablet, Rfl: 0 .  Ensure Plus (ENSURE PLUS) LIQD, Take 237 mLs by mouth 2 (two) times daily between meals., Disp: 60 Can, Rfl: 5 .  escitalopram (LEXAPRO) 20 MG tablet, Take 1 tablet (20 mg total) by mouth daily., Disp: 90 tablet, Rfl: 2 .  levothyroxine (SYNTHROID) 75 MCG tablet, TAKE 1 TABLET BY MOUTH EVERY DAY, Disp: 90 tablet, Rfl: 1 .  mirtazapine (REMERON) 30 MG tablet, Take 1 tablet (30 mg total) by mouth at bedtime., Disp: 90 tablet, Rfl: 1  GAD 7 : Generalized Anxiety Score 03/10/2019 06/23/2018 06/23/2018  Nervous, Anxious, on Edge 1 0 0  Control/stop worrying 3 0 0  Worry too much - different things 2 0 0  Trouble relaxing 0 0 0  Restless 0 0 0  Easily annoyed or irritable 0 0 0  Afraid - awful might happen 2 0  0  Total GAD 7 Score 8 0 0  Anxiety Difficulty Somewhat difficult - Not difficult at all     Assessment/ Plan: 54 y.o. female   1. Anxiety and depression Uncontrolled but this seems situational.  She was unable to come in for an office visit in person today due to ongoing URI symptoms and office restrictions.  No red flag signs or symptoms.  The national narcotic database was reviewed and there were no red flags.  Last refill was January 13, 2019.  Continue Lexapro mirtazapine.  She has follow-up scheduled in February. - ALPRAZolam (XANAX) 0.5 MG tablet; Take 1 tablet (0.5  mg total) by mouth 2 (two) times daily as needed. Must have in office OV before further fills  Dispense: 60 tablet; Refill: 0  2. Vision changes Uncertain etiology.  They spontaneously resolved.  She does have risk for thyroid ocular disease given history of Graves' and now treatment with Synthroid for hypoactive thyroid.  I have placed a formal referral to ophthalmology for evaluation of this.  Of note, medical history is significant for ocular surgery at age 14 years old - Ambulatory referral to Ophthalmology  3. Postablative hypothyroidism Plan for thyroid panel at next visit - Ambulatory referral to Ophthalmology  4. Upper respiratory tract infection, unspecified type I will treat her for allergies with Astelin nasal spray for rhinorrhea and Zyrtec.  Much of what she is describing sounds allergic in nature.  However, I have instructed her to start a Z-Pak on Friday if symptoms are not improving or if they worsen.  She voiced an understanding of the plan and will follow up as needed - azelastine (ASTELIN) 0.1 % nasal spray; Place 1 spray into both nostrils 2 (two) times daily.  Dispense: 30 mL; Refill: 12 - cetirizine (ZYRTEC) 10 MG tablet; Take 1 tablet (10 mg total) by mouth at bedtime.  Dispense: 30 tablet; Refill: 11 - azithromycin (ZITHROMAX) 250 MG tablet; Take 2 tablets today, then take 1 tablet daily until gone.  Start Friday if symptoms are not getting better.  Dispense: 6 tablet; Refill: 0   Start time: 8:55am End time: 9:16am  Total time spent on patient care (including telephone call/ virtual visit): 30 minutes  [place], [place] [phone]

## 2019-04-05 ENCOUNTER — Other Ambulatory Visit: Payer: Self-pay

## 2019-04-06 ENCOUNTER — Encounter: Payer: Self-pay | Admitting: Family Medicine

## 2019-04-06 ENCOUNTER — Ambulatory Visit (INDEPENDENT_AMBULATORY_CARE_PROVIDER_SITE_OTHER): Payer: Medicare HMO | Admitting: Family Medicine

## 2019-04-06 VITALS — BP 139/79 | HR 90 | Temp 96.9°F | Ht 63.0 in | Wt 114.0 lb

## 2019-04-06 DIAGNOSIS — R69 Illness, unspecified: Secondary | ICD-10-CM | POA: Diagnosis not present

## 2019-04-06 DIAGNOSIS — R7989 Other specified abnormal findings of blood chemistry: Secondary | ICD-10-CM | POA: Diagnosis not present

## 2019-04-06 DIAGNOSIS — F419 Anxiety disorder, unspecified: Secondary | ICD-10-CM | POA: Diagnosis not present

## 2019-04-06 DIAGNOSIS — F329 Major depressive disorder, single episode, unspecified: Secondary | ICD-10-CM | POA: Diagnosis not present

## 2019-04-06 DIAGNOSIS — E890 Postprocedural hypothyroidism: Secondary | ICD-10-CM | POA: Diagnosis not present

## 2019-04-06 DIAGNOSIS — Z79891 Long term (current) use of opiate analgesic: Secondary | ICD-10-CM | POA: Diagnosis not present

## 2019-04-06 DIAGNOSIS — R252 Cramp and spasm: Secondary | ICD-10-CM | POA: Diagnosis not present

## 2019-04-06 DIAGNOSIS — F32A Depression, unspecified: Secondary | ICD-10-CM

## 2019-04-06 MED ORDER — ESCITALOPRAM OXALATE 20 MG PO TABS: 20.0000 mg | ORAL_TABLET | Freq: Every day | ORAL | 2 refills | Status: DC

## 2019-04-06 MED ORDER — ALPRAZOLAM 0.5 MG PO TABS: 0.5000 mg | ORAL_TABLET | Freq: Two times a day (BID) | ORAL | 2 refills | Status: DC | PRN

## 2019-04-06 NOTE — Patient Instructions (Addendum)
Next appointment, let's plan for pap smear/ physical.  Have your eye doctor send me details of your visit tomorrow.  You had labs performed today.  You will be contacted with the results of the labs once they are available, usually in the next 3 business days for routine lab work.  If you have an active my chart account, they will be released to your MyChart.  If you prefer to have these labs released to you via telephone, please let us know.  If you had a pap smear or biopsy performed, expect to be contacted in about 7-10 days.  Controlled Substance Guidelines:  1. You cannot get an early refill, even it is lost.  2. You cannot get controlled medications from any other doctor, unless it is the emergency department and related to a new problem or injury.  3. You cannot use alcohol, marijuana, cocaine or any other recreational drugs while using this medication. This is very dangerous.  4. You are willing to have your urine drug tested at each visit.  5. You will not drive while using this medication, because that can put yourself and others in serious danger of an accident. 6. If any medication is stolen, then there must be a police report to verify it, or it cannot be refilled.  7. I will not prescribe these medications for longer than 3 months.  8. You must bring your pill bottle to each visit.  9. You must use the same pharmacy for all refills for the medication, unless you clear it with me beforehand.  10. You cannot share or sell this medication.

## 2019-04-06 NOTE — Progress Notes (Signed)
Subjective: CC: f/u GAD, hypothyroidism PCP: Janora Norlander, DO QMG:NOIB L Roberta Brooks is a 54 y.o. female presenting to clinic today for:  1. Hypothyroidism History: History of Graves' disease status post radioablation She reports compliance with half tablet of Synthroid 75 mcg daily.  She has noticed some weight gain since starting this dose.  She denies any constipation, diarrhea, change in voice, heart palpitations or tremors.  2.  Anxiety depression She reports good control of anxiety depression with mirtazapine 30 mg nightly, Lexapro 20 mg daily and Xanax 0.5 mg twice daily.  Denies any excessive sedation, falls, mental status changes or memory loss.  3.  Cramping Patient reports intermittent cramping of various parts of her body.  She notes initially this started out as a unilateral cramping of her abdomen.  It felt like when she was pregnant and going through labor.  She notes that she has had similar cramping in the right side of her back.  The cramping only lasts for a few minutes and resolves independently.  She feels that she is hydrating adequately but goes on to states she mostly drinks Sprite and 7-Up's.  She does not really drink water.  ROS: Per HPI  Allergies  Allergen Reactions  . Penicillins Hives   Past Medical History:  Diagnosis Date  . Allergy   . Anxiety   . Depression   . Diverticulosis of colon   . Eye muscle weakness   . Grave's disease   . Unspecified hypothyroidism 09/16/2012    Current Outpatient Medications:  .  ALPRAZolam (XANAX) 0.5 MG tablet, Take 1 tablet (0.5 mg total) by mouth 2 (two) times daily as needed. Must have in office OV before further fills, Disp: 60 tablet, Rfl: 0 .  azelastine (ASTELIN) 0.1 % nasal spray, Place 1 spray into both nostrils 2 (two) times daily., Disp: 30 mL, Rfl: 12 .  azithromycin (ZITHROMAX) 250 MG tablet, Take 2 tablets today, then take 1 tablet daily until gone.  Start Friday if symptoms are not getting better.,  Disp: 6 tablet, Rfl: 0 .  cetirizine (ZYRTEC) 10 MG tablet, Take 1 tablet (10 mg total) by mouth at bedtime., Disp: 30 tablet, Rfl: 11 .  Ensure Plus (ENSURE PLUS) LIQD, Take 237 mLs by mouth 2 (two) times daily between meals., Disp: 60 Can, Rfl: 5 .  escitalopram (LEXAPRO) 20 MG tablet, Take 1 tablet (20 mg total) by mouth daily., Disp: 90 tablet, Rfl: 2 .  levothyroxine (SYNTHROID) 75 MCG tablet, TAKE 1 TABLET BY MOUTH EVERY DAY, Disp: 90 tablet, Rfl: 1 .  mirtazapine (REMERON) 30 MG tablet, Take 1 tablet (30 mg total) by mouth at bedtime., Disp: 90 tablet, Rfl: 1 Social History   Socioeconomic History  . Marital status: Divorced    Spouse name: Not on file  . Number of children: 2  . Years of education: 9  . Highest education level: 9th grade  Occupational History  . Occupation: Disabled  Tobacco Use  . Smoking status: Current Every Day Smoker    Packs/day: 0.50    Years: 39.00    Pack years: 19.50    Types: Cigarettes  . Smokeless tobacco: Never Used  . Tobacco comment: Not interested in quitting right now  Substance and Sexual Activity  . Alcohol use: No    Alcohol/week: 0.0 standard drinks  . Drug use: No  . Sexual activity: Not Currently  Other Topics Concern  . Not on file  Social History Narrative  . Not on  file   Social Determinants of Health   Financial Resource Strain: High Risk  . Difficulty of Paying Living Expenses: Hard  Food Insecurity:   . Worried About Charity fundraiser in the Last Year: Not on file  . Ran Out of Food in the Last Year: Not on file  Transportation Needs:   . Lack of Transportation (Medical): Not on file  . Lack of Transportation (Non-Medical): Not on file  Physical Activity:   . Days of Exercise per Week: Not on file  . Minutes of Exercise per Session: Not on file  Stress:   . Feeling of Stress : Not on file  Social Connections:   . Frequency of Communication with Friends and Family: Not on file  . Frequency of Social Gatherings  with Friends and Family: Not on file  . Attends Religious Services: Not on file  . Active Member of Clubs or Organizations: Not on file  . Attends Archivist Meetings: Not on file  . Marital Status: Not on file  Intimate Partner Violence:   . Fear of Current or Ex-Partner: Not on file  . Emotionally Abused: Not on file  . Physically Abused: Not on file  . Sexually Abused: Not on file   Family History  Problem Relation Age of Onset  . Heart disease Mother   . Arthritis Mother   . Dementia Mother   . Graves' disease Father   . Healthy Daughter   . Healthy Son   . Graves' disease Paternal Grandmother   . Clotting disorder Brother   . Heart disease Brother   . Colon cancer Neg Hx   . Colon polyps Neg Hx   . Esophageal cancer Neg Hx   . Rectal cancer Neg Hx   . Stomach cancer Neg Hx     Objective: Office vital signs reviewed. BP 139/79   Pulse 90   Temp (!) 96.9 F (36.1 C) (Temporal)   Ht '5\' 3"'$  (1.6 m)   Wt 114 lb (51.7 kg)   SpO2 96%   BMI 20.19 kg/m   Physical Examination:  General: Awake, alert, thin, chronically ill appearing, No acute distress HEENT: Normal; no exophthalmos Cardio: regular rate and rhythm, S1S2 heard, no murmurs appreciated Pulm: clear to auscultation bilaterally, no wheezes, rhonchi or rales; normal work of breathing on room air Extremities: warm, well perfused, No edema, cyanosis or clubbing; +2 pulses bilaterally MSK: Ambulates independently Skin: dry; intact; normal temperature Neuro: No tremor Psych: Mood stable, speech somewhat slowed but clear, affect appropriate, good eye contact. Depression screen [facility] 2/9 04/06/2019 08/24/2018 06/23/2018  Decreased Interest 3 0 0  Down, Depressed, Hopeless 3 0 0  PHQ - 2 Score 6 0 0  Altered sleeping 0 - 0  Tired, decreased energy 1 - 0  Change in appetite 0 - 0  Feeling bad or failure about yourself  0 - 0  Trouble concentrating 0 - 0  Moving slowly or fidgety/restless 0 - 0  Suicidal  thoughts 0 - 0  PHQ-9 Score 7 - 0  Difficult doing work/chores Not difficult at all - -  Some recent data might be hidden   GAD 7 : Generalized Anxiety Score 04/06/2019 03/10/2019 06/23/2018 06/23/2018  Nervous, Anxious, on Edge 3 1 0 0  Control/stop worrying 3 3 0 0  Worry too much - different things 3 2 0 0  Trouble relaxing 1 0 0 0  Restless 0 0 0 0  Easily annoyed or irritable 1 0  0 0  Afraid - awful might happen 1 2 0 0  Total GAD 7 Score 12 8 0 0  Anxiety Difficulty Somewhat difficult Somewhat difficult - Not difficult at all    Assessment/ Plan: 54 y.o. female   1. Postablative hypothyroidism Asymptomatic.  Check thyroid panel - CMP14+EGFR - Thyroid Panel With TSH  2. Anxiety and depression Renew controlled substance contract, update UDS.  Last use of alprazolam was last evening.  Lexapro renewed. - ToxASSURE Select 13 (MW), Urine - ALPRAZolam (XANAX) 0.5 MG tablet; Take 1 tablet (0.5 mg total) by mouth 2 (two) times daily as needed for anxiety.  Dispense: 60 tablet; Refill: 2 - escitalopram (LEXAPRO) 20 MG tablet; Take 1 tablet (20 mg total) by mouth daily.  Dispense: 90 tablet; Refill: 2  3. Elevated serum creatinine Check renal function given elevation in creatinine last visit - CMP14+EGFR  4. Muscle cramp Uncertain etiology.  Potentially due to dehydration.  We discussed increasing water intake.  We will check for electrolyte imbalance.  Given quick onset and quick offset, would hesitate to use any medications to treat but maybe would help prevent future episodes. - CMP14+EGFR - Magnesium   Orders Placed This Encounter  Procedures  . ToxASSURE Select 13 (MW), Urine  . CMP14+EGFR  . Thyroid Panel With TSH   No orders of the defined types were placed in this encounter.    Janora Norlander, DO [place] [phone]

## 2019-04-08 ENCOUNTER — Other Ambulatory Visit: Payer: Self-pay

## 2019-04-08 ENCOUNTER — Other Ambulatory Visit: Payer: Self-pay | Admitting: Family Medicine

## 2019-04-08 DIAGNOSIS — R7989 Other specified abnormal findings of blood chemistry: Secondary | ICD-10-CM

## 2019-04-08 LAB — CMP14+EGFR
ALT: 20 IU/L (ref 0–32)
AST: 35 IU/L (ref 0–40)
Albumin/Globulin Ratio: 1.9 (ref 1.2–2.2)
Albumin: 4.5 g/dL (ref 3.8–4.9)
Alkaline Phosphatase: 66 IU/L (ref 39–117)
BUN/Creatinine Ratio: 15 (ref 9–23)
BUN: 19 mg/dL (ref 6–24)
Bilirubin Total: 0.4 mg/dL (ref 0.0–1.2)
CO2: 23 mmol/L (ref 20–29)
Calcium: 9.4 mg/dL (ref 8.7–10.2)
Chloride: 101 mmol/L (ref 96–106)
Creatinine, Ser: 1.3 mg/dL — ABNORMAL HIGH (ref 0.57–1.00)
GFR calc Af Amer: 54 mL/min/{1.73_m2} — ABNORMAL LOW (ref >59)
GFR calc non Af Amer: 47 mL/min/{1.73_m2} — ABNORMAL LOW (ref >59)
Globulin, Total: 2.4 g/dL (ref 1.5–4.5)
Glucose: 99 mg/dL (ref 65–99)
Potassium: 3.7 mmol/L (ref 3.5–5.2)
Sodium: 141 mmol/L (ref 134–144)
Total Protein: 6.9 g/dL (ref 6.0–8.5)

## 2019-04-08 LAB — THYROID PANEL WITH TSH
Free Thyroxine Index: 0.9 — ABNORMAL LOW (ref 1.2–4.9)
T3 Uptake Ratio: 17 % — ABNORMAL LOW (ref 24–39)
T4, Total: 5.3 ug/dL (ref 4.5–12.0)
TSH: 49.1 u[IU]/mL — ABNORMAL HIGH (ref 0.450–4.500)

## 2019-04-08 LAB — MAGNESIUM: Magnesium: 1.9 mg/dL (ref 1.6–2.3)

## 2019-04-08 LAB — TOXASSURE SELECT 13 (MW), URINE

## 2019-04-08 MED ORDER — LEVOTHYROXINE SODIUM 75 MCG PO TABS: ORAL_TABLET | ORAL | 0 refills | Status: DC

## 2019-04-29 DIAGNOSIS — E0500 Thyrotoxicosis with diffuse goiter without thyrotoxic crisis or storm: Secondary | ICD-10-CM | POA: Diagnosis not present

## 2019-05-13 DIAGNOSIS — N1831 Chronic kidney disease, stage 3a: Secondary | ICD-10-CM | POA: Insufficient documentation

## 2019-05-13 HISTORY — DX: Chronic kidney disease, stage 3a: N18.31

## 2019-06-07 ENCOUNTER — Encounter: Payer: Self-pay | Admitting: Family Medicine

## 2019-07-01 ENCOUNTER — Other Ambulatory Visit: Payer: Self-pay | Admitting: Family Medicine

## 2019-07-01 DIAGNOSIS — E440 Moderate protein-calorie malnutrition: Secondary | ICD-10-CM

## 2019-07-01 DIAGNOSIS — F329 Major depressive disorder, single episode, unspecified: Secondary | ICD-10-CM

## 2019-07-01 DIAGNOSIS — F419 Anxiety disorder, unspecified: Secondary | ICD-10-CM

## 2019-07-01 DIAGNOSIS — F32A Depression, unspecified: Secondary | ICD-10-CM

## 2019-07-25 ENCOUNTER — Other Ambulatory Visit: Payer: Self-pay | Admitting: Family Medicine

## 2019-07-25 DIAGNOSIS — E440 Moderate protein-calorie malnutrition: Secondary | ICD-10-CM

## 2019-07-25 DIAGNOSIS — F32A Depression, unspecified: Secondary | ICD-10-CM

## 2019-07-25 DIAGNOSIS — F419 Anxiety disorder, unspecified: Secondary | ICD-10-CM

## 2019-07-27 NOTE — Telephone Encounter (Signed)
Appt made

## 2019-08-27 ENCOUNTER — Encounter: Payer: Self-pay | Admitting: Family Medicine

## 2019-09-07 ENCOUNTER — Encounter: Payer: Medicare HMO | Admitting: Family Medicine

## 2019-09-27 ENCOUNTER — Other Ambulatory Visit: Payer: Self-pay | Admitting: Family Medicine

## 2019-09-27 DIAGNOSIS — E440 Moderate protein-calorie malnutrition: Secondary | ICD-10-CM

## 2019-09-27 DIAGNOSIS — F419 Anxiety disorder, unspecified: Secondary | ICD-10-CM

## 2019-09-27 DIAGNOSIS — F329 Major depressive disorder, single episode, unspecified: Secondary | ICD-10-CM

## 2019-09-27 DIAGNOSIS — F32A Depression, unspecified: Secondary | ICD-10-CM

## 2019-10-06 ENCOUNTER — Ambulatory Visit (INDEPENDENT_AMBULATORY_CARE_PROVIDER_SITE_OTHER): Payer: Medicare HMO | Admitting: Family Medicine

## 2019-10-06 ENCOUNTER — Other Ambulatory Visit: Payer: Self-pay

## 2019-10-06 ENCOUNTER — Encounter: Payer: Self-pay | Admitting: Family Medicine

## 2019-10-06 VITALS — BP 135/76 | HR 115 | Temp 96.7°F | Ht 63.0 in | Wt 120.0 lb

## 2019-10-06 DIAGNOSIS — E890 Postprocedural hypothyroidism: Secondary | ICD-10-CM

## 2019-10-06 DIAGNOSIS — E440 Moderate protein-calorie malnutrition: Secondary | ICD-10-CM

## 2019-10-06 DIAGNOSIS — F329 Major depressive disorder, single episode, unspecified: Secondary | ICD-10-CM

## 2019-10-06 DIAGNOSIS — F419 Anxiety disorder, unspecified: Secondary | ICD-10-CM

## 2019-10-06 DIAGNOSIS — R69 Illness, unspecified: Secondary | ICD-10-CM | POA: Diagnosis not present

## 2019-10-06 DIAGNOSIS — Z79891 Long term (current) use of opiate analgesic: Secondary | ICD-10-CM | POA: Diagnosis not present

## 2019-10-06 DIAGNOSIS — F32A Depression, unspecified: Secondary | ICD-10-CM

## 2019-10-06 MED ORDER — MIRTAZAPINE 45 MG PO TABS: 45.0000 mg | ORAL_TABLET | Freq: Every day | ORAL | 0 refills | Status: DC

## 2019-10-06 MED ORDER — ESCITALOPRAM OXALATE 20 MG PO TABS: 10.0000 mg | ORAL_TABLET | Freq: Every day | ORAL | 0 refills | Status: DC

## 2019-10-06 MED ORDER — ALPRAZOLAM 0.5 MG PO TABS: 0.5000 mg | ORAL_TABLET | Freq: Two times a day (BID) | ORAL | 2 refills | Status: DC | PRN

## 2019-10-06 NOTE — Progress Notes (Signed)
Subjective: CC: f/u GAD, hypothyroidism PCP: Janora Norlander, DO MWU:XLKG L Roberta Brooks is a 54 y.o. female presenting to clinic today for:  1. Hypothyroidism History: History of Graves' disease status post radioablation  TSH grossly elevated last visit.  Her Synthroid was increased to 57mcg daily except 37.5mg  on Sundays.   She denies any constipation, diarrhea, change in voice, heart palpitations but she has been having some mild tremor since running out of her Xanax 1 week ago.  2.  Anxiety depression Symptoms had been under control with mirtazapine 30 milligrams nightly, Lexapro 20 mg and Xanax.  She notes that she try to go up on the Lexapro to 1-1/2 tablets not too long ago and she felt ill off of this.  This was of course and increase of her own accord.  She does report increased anxiety.  Her appetite is good.  She typically uses the Xanax daily if not twice daily.  Denies any memory status changes, falls, excessive daytime sleepiness.  She has been utilizing CBD Gummies, 71 mg, in efforts to improve sleep but has not found that this is especially helpful either.   ROS: Per HPI  Allergies  Allergen Reactions  . Penicillins Hives   Past Medical History:  Diagnosis Date  . Allergy   . Anxiety   . Depression   . Diverticulosis of colon   . Eye muscle weakness   . Grave's disease   . Unspecified hypothyroidism 09/16/2012    Current Outpatient Medications:  .  ALPRAZolam (XANAX) 0.5 MG tablet, Take 1 tablet (0.5 mg total) by mouth 2 (two) times daily as needed for anxiety., Disp: 60 tablet, Rfl: 2 .  azelastine (ASTELIN) 0.1 % nasal spray, Place 1 spray into both nostrils 2 (two) times daily., Disp: 30 mL, Rfl: 12 .  cetirizine (ZYRTEC) 10 MG tablet, Take 1 tablet (10 mg total) by mouth at bedtime., Disp: 30 tablet, Rfl: 11 .  Ensure Plus (ENSURE PLUS) LIQD, Take 237 mLs by mouth 2 (two) times daily between meals., Disp: 60 Can, Rfl: 5 .  escitalopram (LEXAPRO) 20 MG  tablet, Take 1 tablet (20 mg total) by mouth daily., Disp: 90 tablet, Rfl: 2 .  levothyroxine (SYNTHROID) 75 MCG tablet, One tablet by mouth daily, 1/2 tablet on Sundays., Disp: 90 tablet, Rfl: 0 .  mirtazapine (REMERON) 30 MG tablet, Take 1 tablet (30 mg total) by mouth at bedtime., Disp: 30 tablet, Rfl: 0 Social History   Socioeconomic History  . Marital status: Divorced    Spouse name: Not on file  . Number of children: 2  . Years of education: 9  . Highest education level: 9th grade  Occupational History  . Occupation: Disabled  Tobacco Use  . Smoking status: Current Every Day Smoker    Packs/day: 0.50    Years: 39.00    Pack years: 19.50    Types: Cigarettes  . Smokeless tobacco: Never Used  . Tobacco comment: Not interested in quitting right now  Vaping Use  . Vaping Use: Never used  Substance and Sexual Activity  . Alcohol use: No    Alcohol/week: 0.0 standard drinks  . Drug use: No  . Sexual activity: Not Currently  Other Topics Concern  . Not on file  Social History Narrative  . Not on file   Social Determinants of Health   Financial Resource Strain:   . Difficulty of Paying Living Expenses:   Food Insecurity:   . Worried About Charity fundraiser in  the Last Year:   . [place] in the Last Year:   Transportation Needs:   . Film/video editor (Medical):   Marland Kitchen Lack of Transportation (Non-Medical):   Physical Activity:   . Days of Exercise per Week:   . Minutes of Exercise per Session:   Stress:   . Feeling of Stress :   Social Connections:   . Frequency of Communication with Friends and Family:   . Frequency of Social Gatherings with Friends and Family:   . Attends Religious Services:   . Active Member of Clubs or Organizations:   . Attends Archivist Meetings:   Marland Kitchen Marital Status:   Intimate Partner Violence:   . Fear of Current or Ex-Partner:   . Emotionally Abused:   Marland Kitchen Physically Abused:   . Sexually Abused:    Family History    Problem Relation Age of Onset  . Heart disease Mother   . Arthritis Mother   . Dementia Mother   . Graves' disease Father   . Healthy Daughter   . Healthy Son   . Graves' disease Paternal Grandmother   . Clotting disorder Brother   . Heart disease Brother   . Colon cancer Neg Hx   . Colon polyps Neg Hx   . Esophageal cancer Neg Hx   . Rectal cancer Neg Hx   . Stomach cancer Neg Hx     Objective: Office vital signs reviewed. BP 135/76   Pulse (!) 115   Temp (!) 96.7 F (35.9 C)   Ht 5\' 3"  (1.6 m)   Wt 120 lb (54.4 kg)   SpO2 97%   BMI 21.26 kg/m   Physical Examination:  General: Awake, alert, chronically ill appearing, No acute distress HEENT: Normal; no exophthalmos; she does appear somewhat sweaty today Cardio: regular rate and rhythm, S1S2 heard, no murmurs appreciated Pulm: clear to auscultation bilaterally, no wheezes, rhonchi or rales; normal work of breathing on room air Extremities: warm, well perfused, No edema, cyanosis or clubbing; +2 pulses bilaterally Skin: intact; normal temperature Neuro: mild tremor of hands Psych: Mood stable, speech clear, affect appropriate, good eye contact. Depression screen [facility] 2/9 10/06/2019 04/06/2019 08/24/2018  Decreased Interest 0 3 0  Down, Depressed, Hopeless 1 3 0  PHQ - 2 Score 1 6 0  Altered sleeping 3 0 -  Tired, decreased energy 3 1 -  Change in appetite 0 0 -  Feeling bad or failure about yourself  1 0 -  Trouble concentrating 2 0 -  Moving slowly or fidgety/restless 0 0 -  Suicidal thoughts 0 0 -  PHQ-9 Score 10 7 -  Difficult doing work/chores Not difficult at all Not difficult at all -  Some recent data might be hidden   GAD 7 : Generalized Anxiety Score 10/06/2019 04/06/2019 03/10/2019 06/23/2018  Nervous, Anxious, on Edge 3 3 1  0  Control/stop worrying 3 3 3  0  Worry too much - different things 3 3 2  0  Trouble relaxing 1 1 0 0  Restless 1 0 0 0  Easily annoyed or irritable 0 1 0 0  Afraid - awful might  happen 1 1 2  0  Total GAD 7 Score 12 12 8  0  Anxiety Difficulty Somewhat difficult Somewhat difficult Somewhat difficult -    Assessment/ Plan: 54 y.o. female   1. Postablative hypothyroidism Check thyroid panel given sweatiness on exam.  She may be overtreated - Thyroid Panel With TSH - Basic Metabolic Panel  2. Anxiety and depression Not well controlled.  I would like her to wean off of the Lexapro as I think that the risk of serotonin syndrome is too high to be on both of these medications.  She will take 1/2 tablet daily for 2 to 3 weeks and then discontinue.  Mirtazapine increased to 45 mg nightly.  I have renewed her alprazolam.  I do worry that she is going through withdrawals and that may also be what is contributing to the sweatiness on today's exam.  Urine drug screen was repeated today.  I anticipate that it will be positive for THC as she does use CBD Gummies.  Last known use of Xanax was 1 week ago - Dispensing optician 13 (MW), Urine - Basic Metabolic Panel - mirtazapine (REMERON) 45 MG tablet; Take 1 tablet (45 mg total) by mouth at bedtime.  Dispense: 90 tablet; Refill: 0 - escitalopram (LEXAPRO) 20 MG tablet; Take 0.5 tablets (10 mg total) by mouth daily for 14 days.  Dispense: 7 tablet; Refill: 0 - ALPRAZolam (XANAX) 0.5 MG tablet; Take 1 tablet (0.5 mg total) by mouth 2 (two) times daily as needed for anxiety.  Dispense: 60 tablet; Refill: 2  3. Moderate protein-calorie malnutrition ([place]) Weight gain is now placed her in normal range for BMI - Basic Metabolic Panel - mirtazapine (REMERON) 45 MG tablet; Take 1 tablet (45 mg total) by mouth at bedtime.  Dispense: 90 tablet; Refill: 0   No orders of the defined types were placed in this encounter.  No orders of the defined types were placed in this encounter.    Janora Norlander, DO [place] [phone]

## 2019-10-06 NOTE — Patient Instructions (Addendum)
Cut the Escitalopram in half and only take 1/2 tablet daily.  Do this for 2 weeks and then stop  I have increased your dose of mirtazapine to 45 mg daily.  Take 1 tablet every day at bedtime.  Controlled Substance Guidelines:  1. You cannot get an early refill, even it is lost.  2. You cannot get controlled medications from any other doctor, unless it is the emergency department and related to a new problem or injury.  3. You cannot use alcohol, marijuana, cocaine or any other recreational drugs while using this medication. This is very dangerous.  4. You are willing to have your urine drug tested at each visit.  5. You will not drive while using this medication, because that can put yourself and others in serious danger of an accident. 6. If any medication is stolen, then there must be a police report to verify it, or it cannot be refilled.  7. I will not prescribe these medications for longer than 3 months.  8. You must bring your pill bottle to each visit.  9. You must use the same pharmacy for all refills for the medication, unless you clear it with me beforehand.  10. You cannot share or sell this medication.

## 2019-10-07 LAB — THYROID PANEL WITH TSH
Free Thyroxine Index: 2.6 (ref 1.2–4.9)
T3 Uptake Ratio: 27 % (ref 24–39)
T4, Total: 9.6 ug/dL (ref 4.5–12.0)
TSH: 1.65 u[IU]/mL (ref 0.450–4.500)

## 2019-10-07 LAB — BASIC METABOLIC PANEL
BUN/Creatinine Ratio: 19 (ref 9–23)
BUN: 20 mg/dL (ref 6–24)
CO2: 23 mmol/L (ref 20–29)
Calcium: 9.7 mg/dL (ref 8.7–10.2)
Chloride: 104 mmol/L (ref 96–106)
Creatinine, Ser: 1.03 mg/dL — ABNORMAL HIGH (ref 0.57–1.00)
GFR calc Af Amer: 71 mL/min/{1.73_m2} (ref >59)
GFR calc non Af Amer: 61 mL/min/{1.73_m2} (ref >59)
Glucose: 106 mg/dL — ABNORMAL HIGH (ref 65–99)
Potassium: 3.8 mmol/L (ref 3.5–5.2)
Sodium: 142 mmol/L (ref 134–144)

## 2019-10-08 ENCOUNTER — Other Ambulatory Visit: Payer: Self-pay | Admitting: Family Medicine

## 2019-10-08 LAB — TOXASSURE SELECT 13 (MW), URINE

## 2019-10-08 MED ORDER — LEVOTHYROXINE SODIUM 75 MCG PO TABS: ORAL_TABLET | ORAL | 3 refills | Status: DC

## 2019-11-18 ENCOUNTER — Other Ambulatory Visit (HOSPITAL_COMMUNITY): Payer: Self-pay | Admitting: Nephrology

## 2019-11-18 ENCOUNTER — Other Ambulatory Visit: Payer: Self-pay | Admitting: Nephrology

## 2019-11-18 DIAGNOSIS — R030 Elevated blood-pressure reading, without diagnosis of hypertension: Secondary | ICD-10-CM | POA: Diagnosis not present

## 2019-11-18 DIAGNOSIS — N1831 Chronic kidney disease, stage 3a: Secondary | ICD-10-CM | POA: Diagnosis not present

## 2019-11-18 DIAGNOSIS — Z716 Tobacco abuse counseling: Secondary | ICD-10-CM | POA: Diagnosis not present

## 2019-11-18 DIAGNOSIS — Z79899 Other long term (current) drug therapy: Secondary | ICD-10-CM

## 2019-11-18 DIAGNOSIS — R809 Proteinuria, unspecified: Secondary | ICD-10-CM

## 2019-11-18 DIAGNOSIS — R69 Illness, unspecified: Secondary | ICD-10-CM | POA: Diagnosis not present

## 2019-11-18 DIAGNOSIS — E890 Postprocedural hypothyroidism: Secondary | ICD-10-CM

## 2019-11-24 ENCOUNTER — Other Ambulatory Visit: Payer: Medicare HMO

## 2019-11-24 ENCOUNTER — Other Ambulatory Visit: Payer: Self-pay

## 2019-11-24 DIAGNOSIS — N1831 Chronic kidney disease, stage 3a: Secondary | ICD-10-CM | POA: Diagnosis not present

## 2019-11-24 DIAGNOSIS — Z79899 Other long term (current) drug therapy: Secondary | ICD-10-CM | POA: Diagnosis not present

## 2019-11-24 DIAGNOSIS — R030 Elevated blood-pressure reading, without diagnosis of hypertension: Secondary | ICD-10-CM | POA: Diagnosis not present

## 2019-11-24 DIAGNOSIS — R809 Proteinuria, unspecified: Secondary | ICD-10-CM | POA: Diagnosis not present

## 2019-11-24 DIAGNOSIS — E890 Postprocedural hypothyroidism: Secondary | ICD-10-CM | POA: Diagnosis not present

## 2019-11-24 DIAGNOSIS — D509 Iron deficiency anemia, unspecified: Secondary | ICD-10-CM | POA: Diagnosis not present

## 2019-11-24 DIAGNOSIS — R69 Illness, unspecified: Secondary | ICD-10-CM | POA: Diagnosis not present

## 2019-11-25 ENCOUNTER — Ambulatory Visit (HOSPITAL_COMMUNITY)
Admission: RE | Admit: 2019-11-25 | Discharge: 2019-11-25 | Disposition: A | Discharge status: Home / Self Care | Payer: Medicare HMO | Source: Ambulatory Visit | Attending: Nephrology | Admitting: Nephrology

## 2019-11-25 DIAGNOSIS — R030 Elevated blood-pressure reading, without diagnosis of hypertension: Secondary | ICD-10-CM | POA: Diagnosis not present

## 2019-11-25 DIAGNOSIS — Z79899 Other long term (current) drug therapy: Secondary | ICD-10-CM

## 2019-11-25 DIAGNOSIS — E890 Postprocedural hypothyroidism: Secondary | ICD-10-CM

## 2019-11-25 DIAGNOSIS — R809 Proteinuria, unspecified: Secondary | ICD-10-CM | POA: Diagnosis not present

## 2019-11-25 DIAGNOSIS — N1831 Chronic kidney disease, stage 3a: Secondary | ICD-10-CM

## 2019-11-25 DIAGNOSIS — N189 Chronic kidney disease, unspecified: Secondary | ICD-10-CM | POA: Diagnosis not present

## 2019-12-03 ENCOUNTER — Ambulatory Visit (INDEPENDENT_AMBULATORY_CARE_PROVIDER_SITE_OTHER): Payer: Medicare HMO

## 2019-12-03 DIAGNOSIS — Z0000 Encounter for general adult medical examination without abnormal findings: Secondary | ICD-10-CM

## 2019-12-03 NOTE — Progress Notes (Signed)
MEDICARE ANNUAL WELLNESS VISIT  12/03/2019  Telephone Visit Disclaimer This Medicare AWV was conducted by telephone due to national recommendations for restrictions regarding the COVID-19 Pandemic (e.g. social distancing).  I verified, using two identifiers, that I am speaking with Roberta Brooks or their authorized healthcare agent. I discussed the limitations, risks, security, and privacy concerns of performing an evaluation and management service by telephone and the potential availability of an in-person appointment in the future. The patient expressed understanding and agreed to proceed.  Location of Patient: Home Location of Provider (nurse):  [facility]  Subjective:    Roberta Brooks is a 54 y.o. female patient of Roberta Norlander, DO who had a Medicare Annual Wellness Visit today via telephone. [name] is Disabled and lives with their daughter, her husband and two grandchildren.  She has two children and five grandchildren.. She reports that she is socially active and does interact with friends/family regularly. She is minimally physically active and enjoys spending time with her family, paint by numbers and watching television.  Patient Care Team: Roberta Norlander, DO as PCP - General (Family Medicine) Arlana Lindau, RD as Dietitian (Nutrition)  Advanced Directives 12/03/2019 08/24/2018 08/20/2017 07/26/2016 06/12/2016 04/13/2015 01/26/2015  Does Patient Have a Medical Advance Directive? No No No No No No No  Would patient like information on creating a medical advance directive? No - Patient declined No - Patient declined No - Patient declined Yes (MAU/Ambulatory/Procedural Areas - Information given) - No - patient declined information -    Hospital Utilization Over the Past 12 Months: # of hospitalizations or ER visits: 0 # of surgeries: 0  Review of Systems    Patient reports that her overall health is better compared to last year.  History obtained from chart review and the  patient  Patient Reported Readings (BP, Pulse, CBG, Weight, etc) none  Pain Assessment Pain : 0-10 Pain Score: 6  Pain Type: Acute pain Pain Location: Head Pain Orientation: Mid Pain Descriptors / Indicators: Aching Pain Onset: Today Pain Frequency: Occasional Pain Relieving Factors: Tylenol  Pain Relieving Factors: Tylenol  Current Medications & Allergies (verified) Allergies as of 12/03/2019      Reactions   Penicillins Hives      Medication List       Accurate as of December 03, 2019  2:26 PM. If you have any questions, ask your nurse or doctor.        ALPRAZolam 0.5 MG tablet Commonly known as: XANAX Take 1 tablet (0.5 mg total) by mouth 2 (two) times daily as needed for anxiety.   azelastine 0.1 % nasal spray Commonly known as: ASTELIN Place 1 spray into both nostrils 2 (two) times daily.   cetirizine 10 MG tablet Commonly known as: ZYRTEC Take 1 tablet (10 mg total) by mouth at bedtime.   Ensure Plus Liqd Take 237 mLs by mouth 2 (two) times daily between meals.   escitalopram 20 MG tablet Commonly known as: LEXAPRO Take 0.5 tablets (10 mg total) by mouth daily for 14 days.   levothyroxine 75 MCG tablet Commonly known as: SYNTHROID One tablet by mouth daily, 1/2 tablet on Sundays.   mirtazapine 45 MG tablet Commonly known as: REMERON Take 1 tablet (45 mg total) by mouth at bedtime.       History (reviewed): Past Medical History:  Diagnosis Date   Allergy    Anxiety    Depression    Diverticulosis of colon    Eye muscle  weakness    Grave's disease    Unspecified hypothyroidism 09/16/2012   Past Surgical History:  Procedure Laterality Date   CERVIX LESION DESTRUCTION     COLONOSCOPY     DENTAL SURGERY  07/18/2016   ELBOW SURGERY     EYE MUSCLE SURGERY     HAND SURGERY     TONSILLECTOMY AND ADENOIDECTOMY     Family History  Problem Relation Age of Onset   Heart disease Mother    Arthritis Mother    Dementia Mother      Berenice Primas' disease Father    Healthy Daughter    Healthy Son    Berenice Primas' disease Paternal Grandmother    Clotting disorder Brother    Heart disease Brother    Colon cancer Neg Hx    Colon polyps Neg Hx    Esophageal cancer Neg Hx    Rectal cancer Neg Hx    Stomach cancer Neg Hx    Social History   Socioeconomic History   Marital status: Divorced    Spouse name: Not on file   Number of children: 2   Years of education: 9   Highest education level: 9th grade  Occupational History   Occupation: Disabled  Tobacco Use   Smoking status: Current Every Day Smoker    Packs/day: 0.50    Years: 39.00    Pack years: 19.50    Types: Cigarettes   Smokeless tobacco: Never Used   Tobacco comment: Not interested in quitting right now  Vaping Use   Vaping Use: Never used  Substance and Sexual Activity   Alcohol use: No    Alcohol/week: 0.0 standard drinks   Drug use: No   Sexual activity: Not Currently  Other Topics Concern   Not on file  Social History Narrative   Not on file   Social Determinants of Health   Financial Resource Strain:    Difficulty of Paying Living Expenses: Not on file  Food Insecurity:    Worried About Charity fundraiser in the Last Year: Not on file   [employer] of Food in the Last Year: Not on file  Transportation Needs:    Lack of Transportation (Medical): Not on file   Lack of Transportation (Non-Medical): Not on file  Physical Activity:    Days of Exercise per Week: Not on file   Minutes of Exercise per Session: Not on file  Stress:    Feeling of Stress : Not on file  Social Connections:    Frequency of Communication with Friends and Family: Not on file   Frequency of Social Gatherings with Friends and Family: Not on file   Attends Religious Services: Not on file   Active Member of Clubs or Organizations: Not on file   Attends Archivist Meetings: Not on file   Marital Status: Not on file     Activities of Daily Living In your present state of health, do you have any difficulty performing the following activities: 12/03/2019  Hearing? N  Vision? N  Difficulty concentrating or making decisions? N  Walking or climbing stairs? Y  Comment sometimes is unsteady on her feet  Dressing or bathing? N  Doing errands, shopping? N  Preparing Food and eating ? N  Using the Toilet? N  In the past six months, have you accidently leaked urine? N  Do you have problems with loss of bowel control? N  Managing your Medications? N  Managing your Finances? N  Housekeeping or managing  your Housekeeping? Y  Comment patient must sit down frequently while cleaning house  Some recent data might be hidden   Patient reports that she does sometimes feel unsteady on her feet when walking or climbing stairs and must be very careful.  She also has difficulty when cleaning and must take frequent breaks while doing so.  Patient Education/ Literacy How often do you need to have someone help you when you read instructions, pamphlets, or other written materials from your doctor or pharmacy?: 1 - Never What is the last grade level you completed in school?: 8th grade  Exercise Current Exercise Habits: The patient does not participate in regular exercise at present, Exercise limited by: None identified  Diet Patient reports consuming 2 meals a day and 2 snack(s) a day Patient reports that her primary diet is: Regular Patient reports that she does have regular access to food.   Depression Screen PHQ 2/9 Scores 10/06/2019 04/06/2019 08/24/2018 06/23/2018 06/23/2018 12/05/2017 09/15/2017  PHQ - 2 Score 1 6 0 0 0 0 0  PHQ- 9 Score 10 7 - 0 0 - -     Fall Risk Fall Risk  12/03/2019 10/06/2019 08/24/2018 12/05/2017 08/20/2017  Falls in the past year? 0 0 1 No No  Number falls in past yr: - - 0 - -  Injury with Fall? - - 0 - -  Follow up Falls evaluation completed - - - -     Objective:  [place] seemed  alert and oriented and she participated appropriately during our telephone visit.  Blood Pressure Weight BMI  BP Readings from Last 3 Encounters:  10/06/19 135/76  04/06/19 139/79  06/23/18 137/78   Wt Readings from Last 3 Encounters:  10/06/19 120 lb (54.4 kg)  04/06/19 114 lb (51.7 kg)  01/13/19 103 lb 9.6 oz (47 kg)   BMI Readings from Last 1 Encounters:  10/06/19 21.26 kg/m    *Unable to obtain current vital signs, weight, and BMI due to telephone visit type  Hearing/Vision   [place] did not seem to have difficulty with hearing/understanding during the telephone conversation  Reports that she has had a formal eye exam by an eye care professional within the past year  Reports that she has not had a formal hearing evaluation within the past year *Unable to fully assess hearing and vision during telephone visit type  Cognitive Function: 6CIT Screen 12/03/2019 08/24/2018  What Year? 0 points 0 points  What month? - 0 points  What time? 0 points 0 points  Count back from 20 0 points 0 points  Months in reverse 2 points 0 points  Repeat phrase 4 points 2 points  Total Score - 2   (Normal:0-7, Significant for Dysfunction: >8)  Normal Cognitive Function Screening: Yes   Immunization & Health Maintenance Record Immunization History  Administered Date(s) Administered   DTaP 12/12/1966, 07/03/1970, 05/14/1971   IPV 12/12/1966, 07/03/1970, 05/14/1971   Influenza,inj,Quad PF,6+ Mos 01/20/2013, 12/26/2014, 12/07/2015, 12/05/2017   Influenza-Unspecified 12/26/2014, 12/07/2015, 12/05/2017   Measles 05/05/1970   Moderna SARS-COVID-2 Vaccination 06/11/2019, 07/09/2019   Rubella 10/25/1969, 05/05/1970   Smallpox 12/12/1966   Td 04/26/1977    Health Maintenance  Topic Date Due   Hepatitis C Screening  Never done   TETANUS/TDAP  04/27/1987   PAP SMEAR-Modifier  11/14/2017   MAMMOGRAM  07/29/2018   INFLUENZA VACCINE  09/26/2019   COLONOSCOPY  01/25/2025    COVID-19 Vaccine  Completed   HIV Screening  Completed  Assessment  This is a routine wellness examination for [employer].  Health Maintenance: Due or Overdue Health Maintenance Due  Topic Date Due   Hepatitis C Screening  Never done   TETANUS/TDAP  04/27/1987   PAP SMEAR-Modifier  11/14/2017   MAMMOGRAM  07/29/2018   INFLUENZA VACCINE  09/26/2019    Roberta Brooks does not need a referral for Community Assistance: Care Management:   no Social Work:    no Prescription Assistance:  no Nutrition/Diabetes Education:  no   Plan:  Personalized Goals Goals Addressed            This Visit's Progress    Patient Stated       12/03/2019 AWV Goal: Exercise for General Health   Patient will verbalize understanding of the benefits of increased physical activity:  Exercising regularly is important. It will improve your overall fitness, flexibility, and endurance.  Regular exercise also will improve your overall health. It can help you control your weight, reduce stress, and improve your bone density.  Over the next year, patient will increase physical activity as tolerated with a goal of at least 150 minutes of moderate physical activity per week.   You can tell that you are exercising at a moderate intensity if your heart starts beating faster and you start breathing faster but can still hold a conversation.  Moderate-intensity exercise ideas include:  Walking 1 mile (1.6 km) in about 15 minutes  Biking  Hiking  Golfing  Dancing  Water aerobics  Patient will verbalize understanding of everyday activities that increase physical activity by providing examples like the following: ? Yard work, such as: ? Pushing a Conservation officer, nature ? Raking and bagging leaves ? Washing your car ? Pushing a stroller ? Shoveling snow ? Gardening ? Washing windows or floors  Patient will be able to explain general safety guidelines for exercising:   Before you start a new  exercise program, talk with your health care provider.  Do not exercise so much that you hurt yourself, feel dizzy, or get very short of breath.  Wear comfortable clothes and wear shoes with good support.  Drink plenty of water while you exercise to prevent dehydration or heat stroke.  Work out until your breathing and your heartbeat get faster.       Personalized Health Maintenance & Screening Recommendations  Td vaccine Screening mammography Screening Pap smear and pelvic exam   Lung Cancer Screening Recommended: yes (Low Dose CT Chest recommended if Age 70-80 years, 30 pack-year currently smoking OR have quit w/in past 15 years) Hepatitis C Screening recommended: yes HIV Screening recommended: no  Advanced Directives: Written information was not prepared per patient's request.  Referrals & Orders No orders of the defined types were placed in this encounter.   Follow-up Plan  Follow-up with Roberta Norlander, DO as planned  Schedule mammogram  Schedule pap smear  Schedule dexa scan  I have personally reviewed and noted the following in the patients chart:    Medical and social history  Use of alcohol, tobacco or illicit drugs   Current medications and supplements  Functional ability and status  Nutritional status  Physical activity  Advanced directives  List of other physicians  Hospitalizations, surgeries, and ER visits in previous 12 months  Vitals  Screenings to include cognitive, depression, and falls  Referrals and appointments  In addition, I have reviewed and discussed with Roberta Brooks certain preventive protocols, quality metrics, and best practice recommendations. A  written personalized care plan for preventive services as well as general preventive health recommendations is available and can be mailed to the patient at her request.      Felicity Coyer, LPN    [id number]

## 2019-12-16 DIAGNOSIS — R768 Other specified abnormal immunological findings in serum: Secondary | ICD-10-CM | POA: Diagnosis not present

## 2019-12-16 DIAGNOSIS — Z716 Tobacco abuse counseling: Secondary | ICD-10-CM | POA: Diagnosis not present

## 2019-12-16 DIAGNOSIS — N270 Small kidney, unilateral: Secondary | ICD-10-CM | POA: Diagnosis not present

## 2019-12-16 DIAGNOSIS — N1831 Chronic kidney disease, stage 3a: Secondary | ICD-10-CM | POA: Diagnosis not present

## 2019-12-28 ENCOUNTER — Other Ambulatory Visit: Payer: Self-pay | Admitting: Family Medicine

## 2019-12-28 DIAGNOSIS — E440 Moderate protein-calorie malnutrition: Secondary | ICD-10-CM

## 2019-12-28 DIAGNOSIS — F32A Depression, unspecified: Secondary | ICD-10-CM

## 2020-01-23 ENCOUNTER — Other Ambulatory Visit: Payer: Self-pay | Admitting: Family Medicine

## 2020-01-23 DIAGNOSIS — E440 Moderate protein-calorie malnutrition: Secondary | ICD-10-CM

## 2020-01-23 DIAGNOSIS — F32A Depression, unspecified: Secondary | ICD-10-CM

## 2020-01-24 NOTE — Telephone Encounter (Signed)
[place]. NTBS 30 days given 12/28/19

## 2020-01-24 NOTE — Telephone Encounter (Signed)
Attempted to contact - NA 

## 2020-02-03 ENCOUNTER — Other Ambulatory Visit: Payer: Self-pay | Admitting: Family Medicine

## 2020-02-03 ENCOUNTER — Encounter: Payer: Self-pay | Admitting: Family Medicine

## 2020-02-03 DIAGNOSIS — E440 Moderate protein-calorie malnutrition: Secondary | ICD-10-CM

## 2020-02-03 DIAGNOSIS — F32A Depression, unspecified: Secondary | ICD-10-CM

## 2020-02-03 DIAGNOSIS — F419 Anxiety disorder, unspecified: Secondary | ICD-10-CM

## 2020-02-03 MED ORDER — MIRTAZAPINE 45 MG PO TABS: 45.0000 mg | ORAL_TABLET | Freq: Every day | ORAL | 0 refills | Status: DC

## 2020-02-25 ENCOUNTER — Other Ambulatory Visit: Payer: Self-pay | Admitting: Family Medicine

## 2020-02-25 DIAGNOSIS — F419 Anxiety disorder, unspecified: Secondary | ICD-10-CM

## 2020-02-25 DIAGNOSIS — F32A Depression, unspecified: Secondary | ICD-10-CM

## 2020-03-03 ENCOUNTER — Encounter: Payer: Self-pay | Admitting: Family Medicine

## 2020-03-08 ENCOUNTER — Encounter: Payer: Self-pay | Admitting: Family Medicine

## 2020-03-08 ENCOUNTER — Other Ambulatory Visit: Payer: Self-pay

## 2020-03-08 ENCOUNTER — Ambulatory Visit (INDEPENDENT_AMBULATORY_CARE_PROVIDER_SITE_OTHER): Payer: Medicare HMO | Admitting: Family Medicine

## 2020-03-08 VITALS — BP 146/70 | HR 88 | Temp 98.1°F | Ht 63.0 in | Wt 127.0 lb

## 2020-03-08 DIAGNOSIS — F32A Depression, unspecified: Secondary | ICD-10-CM | POA: Diagnosis not present

## 2020-03-08 DIAGNOSIS — N1831 Chronic kidney disease, stage 3a: Secondary | ICD-10-CM

## 2020-03-08 DIAGNOSIS — Z79899 Other long term (current) drug therapy: Secondary | ICD-10-CM | POA: Diagnosis not present

## 2020-03-08 DIAGNOSIS — E890 Postprocedural hypothyroidism: Secondary | ICD-10-CM | POA: Diagnosis not present

## 2020-03-08 DIAGNOSIS — F419 Anxiety disorder, unspecified: Secondary | ICD-10-CM | POA: Diagnosis not present

## 2020-03-08 DIAGNOSIS — J302 Other seasonal allergic rhinitis: Secondary | ICD-10-CM

## 2020-03-08 DIAGNOSIS — E440 Moderate protein-calorie malnutrition: Secondary | ICD-10-CM | POA: Diagnosis not present

## 2020-03-08 DIAGNOSIS — R69 Illness, unspecified: Secondary | ICD-10-CM | POA: Diagnosis not present

## 2020-03-08 MED ORDER — FLUTICASONE PROPIONATE 50 MCG/ACT NA SUSP: 1.0000 | Freq: Two times a day (BID) | NASAL | 6 refills | Status: DC

## 2020-03-08 MED ORDER — ALPRAZOLAM 0.5 MG PO TABS: 0.5000 mg | ORAL_TABLET | Freq: Two times a day (BID) | ORAL | 3 refills | Status: DC | PRN

## 2020-03-08 MED ORDER — MIRTAZAPINE 45 MG PO TABS: 45.0000 mg | ORAL_TABLET | Freq: Every day | ORAL | 3 refills | Status: DC

## 2020-03-08 NOTE — Progress Notes (Signed)
Subjective: CC: Follow-up hypothyroidism, anxiety, allergic rhinitis PCP: Janora Norlander, DO VQM:GQQP L Grange is a 55 y.o. female presenting to clinic today for:  1.  Hypothyroidism Patient reports compliance with Synthroid 75 mcg daily.  No reports of tremor.  No heart palpitations.  She did have some diarrhea but that has since resolved.  She has gained some weight and has been enjoying macaroni and broccoli quite a bit  2. anxiety and depression Patient is compliant with mirtazapine 45 mg daily.  She uses her alprazolam up to two times daily most days but has been out for about 6 days.  She reserved her one single pill for today that she was coming today's visit.  3. allergic rhinitis Patient reports ongoing nasal congestion.  She is using Astelin but admits that she has not had much drainage.  She is compliant with an oral antihistamine.  No fevers, chills.  She did have a COVID exposure back in December but has never developed any symptoms.  The symptoms have preceded her COVID exposure.  Patient is an active smoker   ROS: Per HPI  Allergies  Allergen Reactions  . Penicillins Hives   Past Medical History:  Diagnosis Date  . Allergy   . Anxiety   . Depression   . Diverticulosis of colon   . Eye muscle weakness   . Grave's disease   . Unspecified hypothyroidism 09/16/2012    Current Outpatient Medications:  .  ALPRAZolam (XANAX) 0.5 MG tablet, Take 1 tablet (0.5 mg total) by mouth 2 (two) times daily as needed for anxiety., Disp: 60 tablet, Rfl: 2 .  azelastine (ASTELIN) 0.1 % nasal spray, Place 1 spray into both nostrils 2 (two) times daily., Disp: 30 mL, Rfl: 12 .  cetirizine (ZYRTEC) 10 MG tablet, Take 1 tablet (10 mg total) by mouth at bedtime., Disp: 30 tablet, Rfl: 11 .  Ensure Plus (ENSURE PLUS) LIQD, Take 237 mLs by mouth 2 (two) times daily between meals., Disp: 60 Can, Rfl: 5 .  levothyroxine (SYNTHROID) 75 MCG tablet, One tablet by mouth daily, 1/2 tablet  on Sundays., Disp: 90 tablet, Rfl: 3 .  mirtazapine (REMERON) 45 MG tablet, Take 1 tablet (45 mg total) by mouth at bedtime. (Needs to be seen before next refill), Disp: 90 tablet, Rfl: 0 Social History   Socioeconomic History  . Marital status: Divorced    Spouse name: Not on file  . Number of children: 2  . Years of education: 9  . Highest education level: 9th grade  Occupational History  . Occupation: Disabled  Tobacco Use  . Smoking status: Current Every Day Smoker    Packs/day: 0.50    Years: 39.00    Pack years: 19.50    Types: Cigarettes  . Smokeless tobacco: Never Used  . Tobacco comment: Not interested in quitting right now  Vaping Use  . Vaping Use: Never used  Substance and Sexual Activity  . Alcohol use: No    Alcohol/week: 0.0 standard drinks  . Drug use: No  . Sexual activity: Not Currently  Other Topics Concern  . Not on file  Social History Narrative  . Not on file   Social Determinants of Health   Financial Resource Strain: Not on file  Food Insecurity: Not on file  Transportation Needs: Not on file  Physical Activity: Not on file  Stress: Not on file  Social Connections: Not on file  Intimate Partner Violence: Not on file   Family History  Problem Relation Age of Onset  . Heart disease Mother   . Arthritis Mother   . Dementia Mother   . Graves' disease Father   . Healthy Daughter   . Healthy Son   . Graves' disease Paternal Grandmother   . Clotting disorder Brother   . Heart disease Brother   . Colon cancer Neg Hx   . Colon polyps Neg Hx   . Esophageal cancer Neg Hx   . Rectal cancer Neg Hx   . Stomach cancer Neg Hx     Objective: Office vital signs reviewed. BP (!) 146/70   Pulse 88   Temp 98.1 F (36.7 C) (Temporal)   Ht 5\' 3"  (1.6 m)   Wt 127 lb (57.6 kg)   SpO2 97%   BMI 22.50 kg/m   Physical Examination:  General: Awake, alert, chronically ill-appearing.  No acute distress HEENT: Normal; no exophthalmos.  No  goiter Cardio: regular rate and rhythm, S1S2 heard, no murmurs appreciated Pulm: clear to auscultation bilaterally, no wheezes, rhonchi or rales; normal work of breathing on room air Extremities: warm, well perfused, No edema, cyanosis or clubbing; +2 pulses bilaterally Psych: Mood is stable, speech is normal.  No tremor.  Patient is pleasant and interactive  Depression screen [facility] 2/9 03/08/2020 12/03/2019 10/06/2019  Decreased Interest 2 0 0  Down, Depressed, Hopeless 1 0 1  PHQ - 2 Score 3 0 1  Altered sleeping 1 - 3  Tired, decreased energy 2 - 3  Change in appetite 0 - 0  Feeling bad or failure about yourself  1 - 1  Trouble concentrating 0 - 2  Moving slowly or fidgety/restless 0 - 0  Suicidal thoughts 0 - 0  PHQ-9 Score 7 - 10  Difficult doing work/chores Somewhat difficult - Not difficult at all  Some recent data might be hidden   GAD 7 : Generalized Anxiety Score 03/08/2020 10/06/2019 04/06/2019 03/10/2019  Nervous, Anxious, on Edge 3 3 3 1   Control/stop worrying 2 3 3 3   Worry too much - different things 2 3 3 2   Trouble relaxing 1 1 1  0  Restless 0 1 0 0  Easily annoyed or irritable 2 0 1 0  Afraid - awful might happen 2 1 1 2   Total GAD 7 Score 12 12 12 8   Anxiety Difficulty Somewhat difficult Somewhat difficult Somewhat difficult Somewhat difficult   Assessment/ Plan: 55 y.o. female   Anxiety and depression - Plan: ALPRAZolam (XANAX) 0.5 MG tablet, mirtazapine (REMERON) 45 MG tablet, ToxASSURE Select 13 (MW), Urine  Chronic prescription benzodiazepine use - Plan: ToxASSURE Select 13 (MW), Urine  Postablative hypothyroidism - Plan: Thyroid Panel With TSH  Chronic kidney disease, stage 3a (Remer) - Plan: Renal Function Panel  Seasonal allergic rhinitis, unspecified trigger - Plan: fluticasone (FLONASE) 50 MCG/ACT nasal spray  Anxiety is stable albeit not entirely controlled.  She is stable on as needed alprazolam regimen, mirtazapine.  These have been renewed.  National  narcotic database was reviewed and there were no red flags.  UDS and CSC were obtained as per office policy today.  Check thyroid panel.  She has gained some weight which is not a bad thing as she had evidence of malnutrition previously  Renal disease noted to be in stage IIIa.  She is followed by renal and has an appointment in the next 3 months.  Check renal function panel  Her allergic rhinitis has not been well controlled and therefore we will add Flonase.  She may continue her oral antihistamine   Orders Placed This Encounter  Procedures  . Renal Function Panel  . Thyroid Panel With TSH   No orders of the defined types were placed in this encounter.    Janora Norlander, DO [place] [phone]

## 2020-03-08 NOTE — Patient Instructions (Signed)

## 2020-03-09 ENCOUNTER — Telehealth: Payer: Self-pay

## 2020-03-09 LAB — RENAL FUNCTION PANEL
Albumin: 4.3 g/dL (ref 3.8–4.9)
BUN/Creatinine Ratio: 18 (ref 9–23)
BUN: 22 mg/dL (ref 6–24)
CO2: 19 mmol/L — ABNORMAL LOW (ref 20–29)
Calcium: 9.2 mg/dL (ref 8.7–10.2)
Chloride: 107 mmol/L — ABNORMAL HIGH (ref 96–106)
Creatinine, Ser: 1.24 mg/dL — ABNORMAL HIGH (ref 0.57–1.00)
GFR calc Af Amer: 57 mL/min/{1.73_m2} — ABNORMAL LOW (ref >59)
GFR calc non Af Amer: 49 mL/min/{1.73_m2} — ABNORMAL LOW (ref >59)
Glucose: 63 mg/dL — ABNORMAL LOW (ref 65–99)
Phosphorus: 3.8 mg/dL (ref 3.0–4.3)
Potassium: 4.1 mmol/L (ref 3.5–5.2)
Sodium: 142 mmol/L (ref 134–144)

## 2020-03-09 LAB — THYROID PANEL WITH TSH
Free Thyroxine Index: 2.4 (ref 1.2–4.9)
T3 Uptake Ratio: 28 % (ref 24–39)
T4, Total: 8.5 ug/dL (ref 4.5–12.0)
TSH: 1.85 u[IU]/mL (ref 0.450–4.500)

## 2020-03-09 NOTE — Telephone Encounter (Signed)
Roberta Brooks KeyDelia Heady - PA Case ID: [id number] Need help? Call us at [phone]  Status Sent to [facility]  Drug [facility] Fluticasone Propionate 50MCG/ACT suspension Form [employer] Electronic PA Form [phone] NCPDP)

## 2020-03-13 NOTE — Telephone Encounter (Signed)
We denied this request under Medicare Part D because: Drugs available without a prescription and over the  counter are excluded from coverage under your Medicare Part D drug plan. Your Medicare Part D drug plan  was asked to cover the requested drug. The requested drug is available without a prescription and available  over the counter

## 2020-03-15 ENCOUNTER — Other Ambulatory Visit: Payer: Self-pay | Admitting: Family Medicine

## 2020-03-15 DIAGNOSIS — Z1231 Encounter for screening mammogram for malignant neoplasm of breast: Secondary | ICD-10-CM

## 2020-03-15 LAB — TOXASSURE SELECT 13 (MW), URINE

## 2020-03-18 ENCOUNTER — Other Ambulatory Visit: Payer: Self-pay | Admitting: Family Medicine

## 2020-03-18 DIAGNOSIS — J069 Acute upper respiratory infection, unspecified: Secondary | ICD-10-CM

## 2020-03-20 ENCOUNTER — Telehealth: Payer: Medicare HMO | Admitting: Emergency Medicine

## 2020-03-20 DIAGNOSIS — J069 Acute upper respiratory infection, unspecified: Secondary | ICD-10-CM | POA: Diagnosis not present

## 2020-03-20 MED ORDER — BENZONATATE 100 MG PO CAPS: 100.0000 mg | ORAL_CAPSULE | Freq: Two times a day (BID) | ORAL | 0 refills | Status: DC | PRN

## 2020-03-20 NOTE — Progress Notes (Signed)

## 2020-03-22 MED ORDER — BENZONATATE 100 MG PO CAPS: 100.0000 mg | ORAL_CAPSULE | Freq: Two times a day (BID) | ORAL | 0 refills | Status: DC | PRN

## 2020-03-22 NOTE — Addendum Note (Signed)
Addended by: Evelina Dun A on: 03/22/2020 11:13 AM   Modules accepted: Orders

## 2020-04-26 ENCOUNTER — Other Ambulatory Visit: Payer: Self-pay | Admitting: Family Medicine

## 2020-04-26 DIAGNOSIS — F32A Depression, unspecified: Secondary | ICD-10-CM

## 2020-04-26 DIAGNOSIS — F419 Anxiety disorder, unspecified: Secondary | ICD-10-CM

## 2020-07-05 ENCOUNTER — Encounter: Payer: Medicare HMO | Admitting: Family Medicine

## 2020-07-07 ENCOUNTER — Encounter: Payer: Self-pay | Admitting: Family Medicine

## 2020-07-31 ENCOUNTER — Other Ambulatory Visit: Payer: Self-pay | Admitting: Family Medicine

## 2020-07-31 DIAGNOSIS — F32A Depression, unspecified: Secondary | ICD-10-CM

## 2020-07-31 DIAGNOSIS — F419 Anxiety disorder, unspecified: Secondary | ICD-10-CM

## 2020-10-27 ENCOUNTER — Other Ambulatory Visit: Payer: Self-pay | Admitting: Family Medicine

## 2020-12-04 ENCOUNTER — Ambulatory Visit (INDEPENDENT_AMBULATORY_CARE_PROVIDER_SITE_OTHER): Payer: Medicare HMO

## 2020-12-04 VITALS — Ht 63.0 in | Wt 125.0 lb

## 2020-12-04 DIAGNOSIS — Z0000 Encounter for general adult medical examination without abnormal findings: Secondary | ICD-10-CM | POA: Diagnosis not present

## 2020-12-04 NOTE — Patient Instructions (Signed)
Ms. Chismar , Thank you for taking time to come for your Medicare Wellness Visit. I appreciate your ongoing commitment to your health goals. Please review the following plan we discussed and let me know if I can assist you in the future.   Screening recommendations/referrals: Colonoscopy: Done 01/26/2015 - Repeat in 10 years  Mammogram: Done 07/28/2017 - Repeat annually (please call to reschedule your appointment soon) Bone Density: Due at age 55 Recommended yearly ophthalmology/optometry visit for glaucoma screening and checkup Recommended yearly dental visit for hygiene and checkup  Vaccinations: Influenza vaccine: Due every fall Pneumococcal vaccine: Due. 2 doses one year apart Tdap vaccine: Due. Every 10 years Shingles vaccine: Due. 2 doses 2-6 months apart  Covid-19: Done 06/11/2019 & 07/09/2019  Advanced directives: Advance directive discussed with you today. Even though you declined this today, please call our office should you change your mind, and we can give you the proper paperwork for you to fill out.   Conditions/risks identified: Aim for 30 minutes of exercise or brisk walking each day, drink 6-8 glasses of water and eat lots of fruits and vegetables.   If you wish to quit smoking, help is available. For free tobacco cessation program offerings call the [facility] at (778) 207-7028 or Live Well Line at 847-868-5814. You may also visit [email or web address] or email [email or web address]@ [email or web address] for more information on other programs.   You may also call 1-800-QUIT-NOW 806-568-5374) or visit www.VirusCrisis.dk or www.[email or web address] for additional resources on smoking cessation.    Next appointment: Follow up in one year for your annual wellness visit.   Preventive Care 40-64 Years, Female Preventive care refers to lifestyle choices and visits with your health care provider that can promote health and wellness. What does preventive care include? A yearly physical  exam. This is also called an annual well check. Dental exams once or twice a year. Routine eye exams. Ask your health care provider how often you should have your eyes checked. Personal lifestyle choices, including: Daily care of your teeth and gums. Regular physical activity. Eating a healthy diet. Avoiding tobacco and drug use. Limiting alcohol use. Practicing safe sex. Taking low-dose aspirin daily starting at age 86. Taking vitamin and mineral supplements as recommended by your health care provider. What happens during an annual well check? The services and screenings done by your health care provider during your annual well check will depend on your age, overall health, lifestyle risk factors, and family history of disease. Counseling  Your health care provider may ask you questions about your: Alcohol use. Tobacco use. Drug use. Emotional well-being. Home and relationship well-being. Sexual activity. Eating habits. Work and work Statistician. Method of birth control. Menstrual cycle. Pregnancy history. Screening  You may have the following tests or measurements: Height, weight, and BMI. Blood pressure. Lipid and cholesterol levels. These may be checked every 5 years, or more frequently if you are over 60 years old. Skin check. Lung cancer screening. You may have this screening every year starting at age 110 if you have a 30-pack-year history of smoking and currently smoke or have quit within the past 15 years. Fecal occult blood test (FOBT) of the stool. You may have this test every year starting at age 20. Flexible sigmoidoscopy or colonoscopy. You may have a sigmoidoscopy every 5 years or a colonoscopy every 10 years starting at age 75. Hepatitis C blood test. Hepatitis B blood test. Sexually transmitted disease (STD) testing. Diabetes screening. This is done by checking your blood  sugar (glucose) after you have not eaten for a while (fasting). You may have this done  every 1-3 years. Mammogram. This may be done every 1-2 years. Talk to your health care provider about when you should start having regular mammograms. This may depend on whether you have a family history of breast cancer. BRCA-related cancer screening. This may be done if you have a family history of breast, ovarian, tubal, or peritoneal cancers. Pelvic exam and Pap test. This may be done every 3 years starting at age 54. Starting at age 68, this may be done every 5 years if you have a Pap test in combination with an HPV test. Bone density scan. This is done to screen for osteoporosis. You may have this scan if you are at high risk for osteoporosis. Discuss your test results, treatment options, and if necessary, the need for more tests with your health care provider. Vaccines  Your health care provider may recommend certain vaccines, such as: Influenza vaccine. This is recommended every year. Tetanus, diphtheria, and acellular pertussis (Tdap, Td) vaccine. You may need a Td booster every 10 years. Zoster vaccine. You may need this after age 41. Pneumococcal 13-valent conjugate (PCV13) vaccine. You may need this if you have certain conditions and were not previously vaccinated. Pneumococcal polysaccharide (PPSV23) vaccine. You may need one or two doses if you smoke cigarettes or if you have certain conditions. Talk to your health care provider about which screenings and vaccines you need and how often you need them. This information is not intended to replace advice given to you by your health care provider. Make sure you discuss any questions you have with your health care provider. Document Released: 03/10/2015 Document Revised: 11/01/2015 Document Reviewed: 12/13/2014 Elsevier Interactive Patient Education  2017 [place] Prevention in the Home Falls can cause injuries. They can happen to people of all ages. There are many things you can do to make your home safe and to help  prevent falls. What can I do on the outside of my home? Regularly fix the edges of walkways and driveways and fix any cracks. Remove anything that might make you trip as you walk through a door, such as a raised step or threshold. Trim any bushes or trees on the path to your home. Use bright outdoor lighting. Clear any walking paths of anything that might make someone trip, such as rocks or tools. Regularly check to see if handrails are loose or broken. Make sure that both sides of any steps have handrails. Any raised decks and porches should have guardrails on the edges. Have any leaves, snow, or ice cleared regularly. Use sand or salt on walking paths during winter. Clean up any spills in your garage right away. This includes oil or grease spills. What can I do in the bathroom? Use night lights. Install grab bars by the toilet and in the tub and shower. Do not use towel bars as grab bars. Use non-skid mats or decals in the tub or shower. If you need to sit down in the shower, use a plastic, non-slip stool. Keep the floor dry. Clean up any water that spills on the floor as soon as it happens. Remove soap buildup in the tub or shower regularly. Attach bath mats securely with double-sided non-slip rug tape. Do not have throw rugs and other things on the floor that can make you trip. What can I do in the bedroom? Use night lights. Make sure that you  have a light by your bed that is easy to reach. Do not use any sheets or blankets that are too big for your bed. They should not hang down onto the floor. Have a firm chair that has side arms. You can use this for support while you get dressed. Do not have throw rugs and other things on the floor that can make you trip. What can I do in the kitchen? Clean up any spills right away. Avoid walking on wet floors. Keep items that you use a lot in easy-to-reach places. If you need to reach something above you, use a strong step stool that has a  grab bar. Keep electrical cords out of the way. Do not use floor polish or wax that makes floors slippery. If you must use wax, use non-skid floor wax. Do not have throw rugs and other things on the floor that can make you trip. What can I do with my stairs? Do not leave any items on the stairs. Make sure that there are handrails on both sides of the stairs and use them. Fix handrails that are broken or loose. Make sure that handrails are as long as the stairways. Check any carpeting to make sure that it is firmly attached to the stairs. Fix any carpet that is loose or worn. Avoid having throw rugs at the top or bottom of the stairs. If you do have throw rugs, attach them to the floor with carpet tape. Make sure that you have a light switch at the top of the stairs and the bottom of the stairs. If you do not have them, ask someone to add them for you. What else can I do to help prevent falls? Wear shoes that: Do not have high heels. Have rubber bottoms. Are comfortable and fit you well. Are closed at the toe. Do not wear sandals. If you use a stepladder: Make sure that it is fully opened. Do not climb a closed stepladder. Make sure that both sides of the stepladder are locked into place. Ask someone to hold it for you, if possible. Clearly mark and make sure that you can see: Any grab bars or handrails. First and last steps. Where the edge of each step is. Use tools that help you move around (mobility aids) if they are needed. These include: Canes. Walkers. Scooters. Crutches. Turn on the lights when you go into a dark area. Replace any light bulbs as soon as they burn out. Set up your furniture so you have a clear path. Avoid moving your furniture around. If any of your floors are uneven, fix them. If there are any pets around you, be aware of where they are. Review your medicines with your doctor. Some medicines can make you feel dizzy. This can increase your chance of  falling. Ask your doctor what other things that you can do to help prevent falls. This information is not intended to replace advice given to you by your health care provider. Make sure you discuss any questions you have with your health care provider. Document Released: 12/08/2008 Document Revised: 07/20/2015 Document Reviewed: 03/18/2014 Elsevier Interactive Patient Education  2017 [employer].

## 2020-12-04 NOTE — Progress Notes (Signed)
Subjective:   Roberta Brooks is a 55 y.o. female who presents for Medicare Annual (Subsequent) preventive examination.  Virtual Visit via Telephone Note  I connected with  Roberta Brooks on 12/04/20 at  2:00 PM EDT by telephone and verified that I am speaking with the correct person using two identifiers.  Location: Patient: Home Provider: [facility] Persons participating in the virtual visit: patient/Nurse Health Advisor   I discussed the limitations, risks, security and privacy concerns of performing an evaluation and management service by telephone and the availability of in person appointments. The patient expressed understanding and agreed to proceed.  Interactive audio and video telecommunications were attempted between this nurse and patient, however failed, due to patient having technical difficulties OR patient did not have access to video capability.  We continued and completed visit with audio only.  Some vital signs may be absent or patient reported.   Evangela Heffler E Klaus Casteneda, LPN   Review of Systems     Cardiac Risk Factors include: sedentary lifestyle;smoking/ tobacco exposure     Objective:    Today's Vitals   12/04/20 1407  Weight: 125 lb (56.7 kg)  Height: 5\' 3"  (1.6 m)   Body mass index is 22.14 kg/m.  Advanced Directives 12/04/2020 12/03/2019 08/24/2018 08/20/2017 07/26/2016 06/12/2016 04/13/2015  Does Patient Have a Medical Advance Directive? No No No No No No No  Would patient like information on creating a medical advance directive? No - Patient declined No - Patient declined No - Patient declined No - Patient declined Yes (MAU/Ambulatory/Procedural Areas - Information given) - No - patient declined information    Current Medications (verified) Outpatient Encounter Medications as of 12/04/2020  Medication Sig   caffeine 200 MG TABS tablet Take by mouth.   levothyroxine (SYNTHROID) 75 MCG tablet TAKE ONE TABLET BY MOUTH DAILY EXCEPT ONE-HALF TABLET ON sundays    mirtazapine (REMERON) 45 MG tablet Take 1 tablet (45 mg total) by mouth at bedtime.   Multiple Vitamin (MULTIVITAMIN) tablet Take by mouth.   ALPRAZolam (XANAX) 0.5 MG tablet Take 1 tablet (0.5 mg total) by mouth 2 (two) times daily as needed for anxiety. (Patient not taking: Reported on 12/04/2020)   azelastine (ASTELIN) 0.1 % nasal spray Place 1 spray into both nostrils 2 (two) times daily. (Patient not taking: Reported on 12/04/2020)   benzonatate (TESSALON) 100 MG capsule Take 1 capsule (100 mg total) by mouth 2 (two) times daily as needed for cough. (Patient not taking: Reported on 12/04/2020)   cetirizine (ZYRTEC) 10 MG tablet TAKE 1 TABLET BY MOUTH EVERYDAY AT BEDTIME (Patient not taking: Reported on 12/04/2020)   Ensure Plus (ENSURE PLUS) LIQD Take 237 mLs by mouth 2 (two) times daily between meals. (Patient not taking: Reported on 12/04/2020)   fluticasone (FLONASE) 50 MCG/ACT nasal spray Place 1 spray into both nostrils in the morning and at bedtime. (Patient not taking: Reported on 12/04/2020)   No facility-administered encounter medications on file as of 12/04/2020.    Allergies (verified) Penicillins   History: Past Medical History:  Diagnosis Date   Allergy    Anxiety    Depression    Diverticulosis of colon    Eye muscle weakness    Grave's disease    Stage 3a chronic kidney disease ([place]) 05/13/2019   Unspecified hypothyroidism 09/16/2012   Past Surgical History:  Procedure Laterality Date   CERVIX LESION DESTRUCTION     COLONOSCOPY     DENTAL SURGERY  07/18/2016   ELBOW SURGERY  EYE MUSCLE SURGERY     HAND SURGERY     TONSILLECTOMY AND ADENOIDECTOMY     Family History  Problem Relation Age of Onset   Heart disease Mother    Arthritis Mother    Dementia Mother    Berenice Primas' disease Father    Healthy Daughter    Healthy Son    Berenice Primas' disease Paternal Grandmother    Clotting disorder Brother    Heart disease Brother    Colon cancer Neg Hx    Colon polyps  Neg Hx    Esophageal cancer Neg Hx    Rectal cancer Neg Hx    Stomach cancer Neg Hx    Social History   Socioeconomic History   Marital status: Divorced    Spouse name: Not on file   Number of children: 2   Years of education: 9   Highest education level: 9th grade  Occupational History   Occupation: Disabled  Tobacco Use   Smoking status: Every Day    Packs/day: 0.50    Years: 39.00    Pack years: 19.50    Types: Cigarettes   Smokeless tobacco: Never   Tobacco comments:    Not interested in quitting right now  Vaping Use   Vaping Use: Never used  Substance and Sexual Activity   Alcohol use: No    Alcohol/week: 0.0 standard drinks   Drug use: No   Sexual activity: Not Currently  Other Topics Concern   Not on file  Social History Narrative   Lives with daughter, son in Sports coach and grandchildren   Social Determinants of Health   Financial Resource Strain: High Risk   Difficulty of Paying Living Expenses: Hard  Food Insecurity: No Food Insecurity   Worried About Charity fundraiser in the Last Year: Never true   Arboriculturist in the Last Year: Never true  Transportation Needs: Unmet Transportation Needs   Lack of Transportation (Medical): Yes   Lack of Transportation (Non-Medical): Yes  Physical Activity: Inactive   Days of Exercise per Week: 0 days   Minutes of Exercise per Session: 0 min  Stress: Stress Concern Present   Feeling of Stress : To some extent  Social Connections: Socially Isolated   Frequency of Communication with Friends and Family: More than three times a week   Frequency of Social Gatherings with Friends and Family: More than three times a week   Attends Religious Services: Never   Marine scientist or Organizations: No   Attends Music therapist: Never   Marital Status: Divorced    Tobacco Counseling Ready to quit: Not Answered Counseling given: Not Answered Tobacco comments: Not interested in quitting right  now   Clinical Intake:  Pre-visit preparation completed: Yes  Pain : No/denies pain     BMI - recorded: 22.14 Nutritional Status: BMI of 19-24  Normal Nutritional Risks: None Diabetes: No  How often do you need to have someone help you when you read instructions, pamphlets, or other written materials from your doctor or pharmacy?: 1 - Never  Diabetic? No  Interpreter Needed?: No  Information entered by :: Cassiopeia Florentino, LPN   Activities of Daily Living In your present state of health, do you have any difficulty performing the following activities: 12/04/2020  Hearing? N  Vision? N  Difficulty concentrating or making decisions? Y  Walking or climbing stairs? N  Dressing or bathing? N  Doing errands, shopping? N  Preparing Food and eating ?  N  Using the Toilet? N  In the past six months, have you accidently leaked urine? N  Do you have problems with loss of bowel control? N  Managing your Medications? N  Managing your Finances? N  Housekeeping or managing your Housekeeping? N  Some recent data might be hidden    Patient Care Team: Janora Norlander, DO as PCP - General (Family Medicine) Arlana Lindau, RD as Dietitian (Nutrition)  Indicate any recent Medical Services you may have received from other than [facility] providers in the past year (date may be approximate).     Assessment:   This is a routine wellness examination for Roberta Brooks.  Hearing/Vision screen Hearing Screening - Comments:: Denies hearing difficulties  Vision Screening - Comments:: Wears rx glasses - up to date with annual eye exams in [place] issues and exercise activities discussed: Current Exercise Habits: The patient does not participate in regular exercise at present, Exercise limited by: None identified   Goals Addressed             This Visit's Progress    DIET - INCREASE WATER INTAKE   Not on track    Exercise 3x per week (30 min per time)   Not on track    Walk for 30  minutes daily       Depression Screen PHQ 2/9 Scores 12/04/2020 03/08/2020 12/03/2019 10/06/2019 04/06/2019 08/24/2018 06/23/2018  PHQ - 2 Score 3 3 0 1 6 0 0  PHQ- 9 Score 7 7 - 10 7 - 0    Fall Risk Fall Risk  12/04/2020 12/03/2019 10/06/2019 08/24/2018 12/05/2017  Falls in the past year? 0 0 0 1 No  Number falls in past yr: 0 - - 0 -  Injury with Fall? 0 - - 0 -  Risk for fall due to : No Fall Risks - - - -  Follow up Falls prevention discussed Falls evaluation completed - - -    FALL RISK PREVENTION PERTAINING TO THE HOME:  Any stairs in or around the home? Yes  If so, are there any without handrails? No  Home free of loose throw rugs in walkways, pet beds, electrical cords, etc? Yes  Adequate lighting in your home to reduce risk of falls? Yes   ASSISTIVE DEVICES UTILIZED TO PREVENT FALLS:  Life alert? No  Use of a cane, walker or w/c? No  Grab bars in the bathroom? No  Shower chair or bench in shower? Yes  Elevated toilet seat or a handicapped toilet? No   TIMED UP AND GO:  Was the test performed? No . Telephonic visit  Cognitive Function: MMSE - Mini Mental State Exam 08/20/2017 07/26/2016 07/26/2016  Orientation to time 4 5 5   Orientation to Place 5 5 5   Registration 3 3 3   Attention/ Calculation 5 5 4   Recall 3 0 0  Language- name 2 objects 2 2 2   Language- repeat 1 1 1   Language- follow 3 step command 3 3 3   Language- read & follow direction 1 1 1   Write a sentence 1 1 1   Copy design 1 1 1   Total score 29 27 26      6CIT Screen 12/04/2020 12/03/2019 08/24/2018  What Year? 0 points 0 points 0 points  What month? 0 points - 0 points  What time? 0 points 0 points 0 points  Count back from 20 0 points 0 points 0 points  Months in reverse 2 points 2 points 0 points  Repeat phrase 2 points 4 points 2 points  Total Score 4 - 2    Immunizations Immunization History  Administered Date(s) Administered   DTaP 12/12/1966, 07/03/1970, 05/14/1971   IPV 12/12/1966,  07/03/1970, 05/14/1971   Influenza,inj,Quad PF,6+ Mos 01/20/2013, 12/26/2014, 12/07/2015, 12/05/2017   Influenza-Unspecified 12/26/2014, 12/07/2015, 12/05/2017   Measles 05/05/1970   Moderna Sars-Covid-2 Vaccination 06/11/2019, 07/09/2019   Rubella 10/25/1969, 05/05/1970   Smallpox 12/12/1966   Td 04/26/1977    TDAP status: Due, Education has been provided regarding the importance of this vaccine. Advised may receive this vaccine at local pharmacy or Health Dept. Aware to provide a copy of the vaccination record if obtained from local pharmacy or Health Dept. Verbalized acceptance and understanding.  Flu Vaccine status: Due, Education has been provided regarding the importance of this vaccine. Advised may receive this vaccine at local pharmacy or Health Dept. Aware to provide a copy of the vaccination record if obtained from local pharmacy or Health Dept. Verbalized acceptance and understanding.  Pneumococcal vaccine status: Due, Education has been provided regarding the importance of this vaccine. Advised may receive this vaccine at local pharmacy or Health Dept. Aware to provide a copy of the vaccination record if obtained from local pharmacy or Health Dept. Verbalized acceptance and understanding.  Covid-19 vaccine status: Information provided on how to obtain vaccines.   Qualifies for Shingles Vaccine? Yes   Zostavax completed No   Shingrix Completed?: No.    Education has been provided regarding the importance of this vaccine. Patient has been advised to call insurance company to determine out of pocket expense if they have not yet received this vaccine. Advised may also receive vaccine at local pharmacy or Health Dept. Verbalized acceptance and understanding.  Screening Tests Health Maintenance  Topic Date Due   Hepatitis C Screening  Never done   Zoster Vaccines- Shingrix (1 of 2) Never done   TETANUS/TDAP  04/27/1987   PAP SMEAR-Modifier  11/14/2017   MAMMOGRAM  07/29/2018    COVID-19 Vaccine (3 - Moderna risk series) 08/06/2019   INFLUENZA VACCINE  09/25/2020   COLONOSCOPY (Pts 45-26yrs Insurance coverage will need to be confirmed)  01/25/2025   HIV Screening  Completed   HPV VACCINES  Aged Out    Health Maintenance  Health Maintenance Due  Topic Date Due   Hepatitis C Screening  Never done   Zoster Vaccines- Shingrix (1 of 2) Never done   TETANUS/TDAP  04/27/1987   PAP SMEAR-Modifier  11/14/2017   MAMMOGRAM  07/29/2018   COVID-19 Vaccine (3 - Moderna risk series) 08/06/2019   INFLUENZA VACCINE  09/25/2020    Colorectal cancer screening: Type of screening: Colonoscopy. Completed 01/26/2015. Repeat every 10 years  Mammogram status: Completed 07/28/2017. Repeat every year She has cancelled several appointments - we discussed the importance and she is going to consider rescheduling soon, but did not want referral or appt today  Bone Density scan: Due at age 1  Lung Cancer Screening: (Low Dose CT Chest recommended if Age 23-80 years, 30 pack-year currently smoking OR have quit w/in 15years.) does qualify.   Lung Cancer Screening Referral:  to discuss with Dr Lajuana Ripple  Additional Screening:  Hepatitis C Screening: does not qualify  Vision Screening: Recommended annual ophthalmology exams for early detection of glaucoma and other disorders of the eye. Is the patient up to date with their annual eye exam?  No  Who is the provider or what is the name of the office in which the patient attends annual  eye exams? None If pt is not established with a provider, would they like to be referred to a provider to establish care? No .   Dental Screening: Recommended annual dental exams for proper oral hygiene  Community Resource Referral / Chronic Care Management: CRR required this visit?  No   CCM required this visit?  No      Plan:     I have personally reviewed and noted the following in the patient's chart:   Medical and social history Use of  alcohol, tobacco or illicit drugs  Current medications and supplements including opioid prescriptions.  Functional ability and status Nutritional status Physical activity Advanced directives List of other physicians Hospitalizations, surgeries, and ER visits in previous 12 months Vitals Screenings to include cognitive, depression, and falls Referrals and appointments  In addition, I have reviewed and discussed with patient certain preventive protocols, quality metrics, and best practice recommendations. A written personalized care plan for preventive services as well as general preventive health recommendations were provided to patient.     Sandrea Hammond, LPN   [id number]   Nurse Notes: I think she could benefit from [employer] Referral and CCM for social worker and or counseling, but she declines - I think her anxiety gets in the way - she refuses to make in person appointment, hardly leaves the house, has cancelled mammograms and specialist appointments.

## 2021-01-04 ENCOUNTER — Telehealth: Payer: Medicare HMO | Admitting: Physician Assistant

## 2021-01-04 DIAGNOSIS — R051 Acute cough: Secondary | ICD-10-CM

## 2021-01-04 NOTE — Progress Notes (Signed)
Based on what you shared with me, I feel your condition warrants further evaluation and I recommend that you be seen in a face to face visit.  Since you are having the right sided pain with coughing, I do feel it is best for you to be seen in person. You may require a chest xray to rule out pneumonia.    NOTE: There will be NO CHARGE for this eVisit   If you are having a true medical emergency please call 911.      For an urgent face to face visit, [facility] has six urgent care centers for your convenience:     [facility] Get Driving Directions [phone] [place], [place] [address]    [facility]) Get Driving Directions [phone] [place], [place] [address]  [place] [facility]) Get Driving Directions [phone] [address] [place],  [place]  [address]  [facility] Get Driving Directions [phone] [place], [place], [place] [address]   [facility] Get Driving Directions  [phone] [address] [place], [place] [address]   [facility] [place] Get Driving Directions [phone] [address], [place], [place] [address]  Your MyChart E-visit questionnaire answers were reviewed by a board certified advanced clinical practitioner to complete your personal care plan based on your specific symptoms.  Thank you for using e-Visits.   I provided 5 minutes of non face-to-face time during this encounter for chart review and documentation.

## 2021-01-05 ENCOUNTER — Ambulatory Visit (INDEPENDENT_AMBULATORY_CARE_PROVIDER_SITE_OTHER): Payer: Medicare HMO | Admitting: Family Medicine

## 2021-01-05 DIAGNOSIS — F5104 Psychophysiologic insomnia: Secondary | ICD-10-CM | POA: Diagnosis not present

## 2021-01-05 DIAGNOSIS — R052 Subacute cough: Secondary | ICD-10-CM

## 2021-01-05 DIAGNOSIS — R69 Illness, unspecified: Secondary | ICD-10-CM | POA: Diagnosis not present

## 2021-01-05 MED ORDER — RAMELTEON 8 MG PO TABS: 8.0000 mg | ORAL_TABLET | Freq: Every day | ORAL | 2 refills | Status: DC

## 2021-01-05 MED ORDER — AZITHROMYCIN 250 MG PO TABS: ORAL_TABLET | ORAL | 0 refills | Status: DC

## 2021-01-05 MED ORDER — BENZONATATE 100 MG PO CAPS: 100.0000 mg | ORAL_CAPSULE | Freq: Two times a day (BID) | ORAL | 0 refills | Status: DC | PRN

## 2021-01-05 NOTE — Progress Notes (Signed)
Telephone visit  Subjective: CC: med refills PCP: Janora Norlander, DO Roberta Brooks is a 55 y.o. female calls for telephone consult today. Patient provides verbal consent for consult held via phone.  Due to COVID-19 pandemic this visit was conducted virtually. This visit type was conducted due to national recommendations for restrictions regarding the COVID-19 Pandemic (e.g. social distancing, sheltering in place) in an effort to limit this patient's exposure and mitigate transmission in our community. All issues noted in this document were discussed and addressed.  A physical exam was not performed with this format.   Location of patient: home Location of provider: [facility] Others present for call: none  1. Cough Patient reports that her grandson got the flu.  She was sick with flu like symptoms and she has been taking OTC flu medications.  She had an evisit yesterday and there was concern for possible pneumonia and she was recommended to got a CXR to evaluate further. She was too embarrassed to go and get an xray because she has hair dye all over her neck and arms.  2.  Insomnia Patient reports that since she is weaned herself from the Xanax she is had some issues with insomnia.  She tried taking over-the-counter products but this has not been helpful.  Has not taken anything like trazodone or ramelteon that she knows of.  She is compliant with mirtazapine every night at bedtime.     ROS: Per HPI  Allergies  Allergen Reactions   Penicillins Hives   Past Medical History:  Diagnosis Date   Allergy    Anxiety    Depression    Diverticulosis of colon    Eye muscle weakness    Grave's disease    Stage 3a chronic kidney disease ([place]) 05/13/2019   Unspecified hypothyroidism 09/16/2012    Current Outpatient Medications:    ALPRAZolam (XANAX) 0.5 MG tablet, Take 1 tablet (0.5 mg total) by mouth 2 (two) times daily as needed for anxiety. (Patient not taking: Reported on  12/04/2020), Disp: 60 tablet, Rfl: 3   caffeine 200 MG TABS tablet, Take by mouth., Disp: , Rfl:    levothyroxine (SYNTHROID) 75 MCG tablet, TAKE ONE TABLET BY MOUTH DAILY EXCEPT ONE-HALF TABLET ON sundays, Disp: 192 tablet, Rfl: 0   mirtazapine (REMERON) 45 MG tablet, Take 1 tablet (45 mg total) by mouth at bedtime., Disp: 90 tablet, Rfl: 3   Multiple Vitamin (MULTIVITAMIN) tablet, Take by mouth., Disp: , Rfl:   Assessment/ Plan: 55 y.o. female   Subacute cough - Plan: azithromycin (ZITHROMAX) 250 MG tablet, benzonatate (TESSALON) 100 MG capsule  Psychophysiological insomnia - Plan: ramelteon (ROZEREM) 8 MG tablet  Ongoing empirically treat her with oral antibiotics for presumed pneumonia since this was very suspected last evening and she is not able to make it to chest x-ray.  Cough medication also sent.  She understands red flag signs and symptoms warranting further evaluation  Trial of ramelteon.  If not covered.  May switch to low-dose trazodone.  Start time: 1:26pm End time: 1:33pm  Total time spent on patient care (including telephone call/ virtual visit): 6 minutes  [place], [place] [phone]

## 2021-01-09 ENCOUNTER — Telehealth: Payer: Self-pay | Admitting: *Deleted

## 2021-01-09 NOTE — Telephone Encounter (Signed)
Drug Ramelteon 8MG  tablets  Key: [id number]  Sent to plan

## 2021-01-15 NOTE — Telephone Encounter (Signed)
Your request was denied We have denied coverage or payment under your Medicare Part D benefit for the following prescription drug(s)  that you or your prescriber requested: RAMELTEON Tablet Why did we deny your request? We denied this request under Medicare Part D because: The requested drug is not on your plan's formulary  (list of covered drugs). Your Medicare Part D drug plan was asked to cover a drug that is not on the formulary  (this is called a formulary exception). Your prescriber did not provide the detailed information that is required  in order to approve the request. To receive a formulary exception, per the Part D Coverage Determination  guidance Section 40.5.2 and 40.5.3, your prescriber must provide information that documents at least one of  the following has occurred:  - You have tried the formulary drugs for the treatment of your condition and they did not work for you. OR - The formulary drugs could cause adverse effects. OR - The formulary drugs would be less effective for your condition than the requested drug.  Talk to your prescriber to see if the following covered alternative(s) would be right for you:  zolpidem tartrate tablet (requires prior authorization for members of age 53 or greater) (quantity limit of 30  Form CMS-10146 OMB Approval No. [id number] (Expires 08/24/2021) tablets every 30 days) zaleplon capsule (requires prior authorization for members of age 47 or greater) (Different quantity limits apply  depending on the strength of the medication prescribed. Please consult the Medicare Part D plan's formulary  for the specific quantity limit.) eszopiclone tablet (requires prior authorization for members of age 70 or greater) (quantity limit of 30 tablets  every 30 days) triazolam tablet (requires prior authorization for members of age 73 or greater) (quantity limit of 60 tablets  every 30 days) temazepam capsule (requires prior authorization for members of age  64 or greater) (quantity limit of 30  capsules every 30 days) doxepin HCl tablet (quantity limit of 30 tablets every 30 days) Belsomra tablet (quantity limit of 30 tablets every 30 days). You should share a copy of this decision with your prescriber so you and your prescriber can discuss next steps.  If your prescriber requested coverage on your behalf, we have shared this decision with your prescriber.

## 2021-01-16 ENCOUNTER — Encounter: Payer: Self-pay | Admitting: Family Medicine

## 2021-01-16 NOTE — Telephone Encounter (Signed)
Please inform pt of denial.  Recommend Melatonin up to 10mg  per night, camomile tea, warm milk, meditation.

## 2021-01-17 ENCOUNTER — Encounter: Payer: Self-pay | Admitting: Family Medicine

## 2021-01-17 NOTE — Telephone Encounter (Signed)
Ent message to her mychart she sent message about this through there

## 2021-01-24 ENCOUNTER — Other Ambulatory Visit: Payer: Self-pay | Admitting: Family Medicine

## 2021-01-24 DIAGNOSIS — F419 Anxiety disorder, unspecified: Secondary | ICD-10-CM

## 2021-04-21 ENCOUNTER — Other Ambulatory Visit: Payer: Self-pay | Admitting: Family Medicine

## 2021-04-21 DIAGNOSIS — F32A Depression, unspecified: Secondary | ICD-10-CM

## 2021-04-21 DIAGNOSIS — F419 Anxiety disorder, unspecified: Secondary | ICD-10-CM

## 2021-04-24 ENCOUNTER — Encounter: Payer: Self-pay | Admitting: Family Medicine

## 2021-04-27 ENCOUNTER — Encounter: Payer: Self-pay | Admitting: Family Medicine

## 2021-05-04 ENCOUNTER — Encounter: Payer: Self-pay | Admitting: Family Medicine

## 2021-05-04 ENCOUNTER — Ambulatory Visit (INDEPENDENT_AMBULATORY_CARE_PROVIDER_SITE_OTHER): Payer: Medicare HMO | Admitting: Family Medicine

## 2021-05-04 VITALS — BP 151/84 | HR 108 | Temp 98.2°F | Ht 63.0 in | Wt 116.0 lb

## 2021-05-04 DIAGNOSIS — F5104 Psychophysiologic insomnia: Secondary | ICD-10-CM

## 2021-05-04 DIAGNOSIS — F419 Anxiety disorder, unspecified: Secondary | ICD-10-CM

## 2021-05-04 DIAGNOSIS — E890 Postprocedural hypothyroidism: Secondary | ICD-10-CM

## 2021-05-04 DIAGNOSIS — L309 Dermatitis, unspecified: Secondary | ICD-10-CM | POA: Diagnosis not present

## 2021-05-04 DIAGNOSIS — F32A Depression, unspecified: Secondary | ICD-10-CM

## 2021-05-04 DIAGNOSIS — R69 Illness, unspecified: Secondary | ICD-10-CM | POA: Diagnosis not present

## 2021-05-04 MED ORDER — MIRTAZAPINE 45 MG PO TABS: 45.0000 mg | ORAL_TABLET | Freq: Every day | ORAL | 3 refills | Status: AC

## 2021-05-04 MED ORDER — KETOCONAZOLE 2 % EX SHAM: MEDICATED_SHAMPOO | CUTANEOUS | 2 refills | Status: AC

## 2021-05-04 MED ORDER — HYDROXYZINE HCL 25 MG PO TABS: 12.5000 mg | ORAL_TABLET | Freq: Every evening | ORAL | 1 refills | Status: DC | PRN

## 2021-05-04 NOTE — Progress Notes (Signed)
? ?Subjective: ?CC: Follow-up depression, insomnia, hypothyroidism ?PCP: Janora Norlander, DO ?PZW:Roberta Brooks is a 56 y.o. female presenting to clinic today for: ? ?1.  Depression, and insomnia ?Patient has been out of her mirtazapine 45 mg for 2 days now.  The insurance would not cover [place].  She tried OTC melatonin but did not find this helpful.  She continues to have difficulty with both falling asleep and staying asleep.  She does not want a be on any controlled substances because she owns a firearm and this is not allowed. ? ?2.  Hypothyroidism ?Patient is compliant with Synthroid 75 mcg daily except for she takes 37.5 mg on Sundays.  No reports of tremor, heart palpitations, change in bowel habits or weight ? ? ?ROS: Per HPI ? ?Allergies  ?Allergen Reactions  ? Penicillins Hives  ? ?Past Medical History:  ?Diagnosis Date  ? Allergy   ? Anxiety   ? Depression   ? Diverticulosis of colon   ? Eye muscle weakness   ? Grave's disease   ? Stage 3a chronic kidney disease ([place]) 05/13/2019  ? Unspecified hypothyroidism 09/16/2012  ? ? ?Current Outpatient Medications:  ?  caffeine 200 MG TABS tablet, Take by mouth., Disp: , Rfl:  ?  levothyroxine (SYNTHROID) 75 MCG tablet, TAKE ONE TABLET BY MOUTH DAILY EXCEPT ONE-HALF TABLET ON sundays, Disp: 192 tablet, Rfl: 0 ?  mirtazapine (REMERON) 45 MG tablet, Take 1 tablet (45 mg total) by mouth at bedtime., Disp: 90 tablet, Rfl: 3 ?  Multiple Vitamin (MULTIVITAMIN) tablet, Take by mouth. (Patient not taking: Reported on 05/04/2021), Disp: , Rfl:  ?Social History  ? ?Socioeconomic History  ? Marital status: Divorced  ?  Spouse name: Not on file  ? Number of children: 2  ? Years of education: 75  ? Highest education level: 9th grade  ?Occupational History  ? Occupation: Disabled  ?Tobacco Use  ? Smoking status: Every Day  ?  Packs/day: 0.50  ?  Years: 39.00  ?  Pack years: 19.50  ?  Types: Cigarettes  ? Smokeless tobacco: Never  ? Tobacco comments:  ?  Not interested in  quitting right now  ?Vaping Use  ? Vaping Use: Never used  ?Substance and Sexual Activity  ? Alcohol use: No  ?  Alcohol/week: 0.0 standard drinks  ? Drug use: No  ? Sexual activity: Not Currently  ?Other Topics Concern  ? Not on file  ?Social History Narrative  ? Lives with daughter, son in law and grandchildren  ? ?Social Determinants of Health  ? ?Financial Resource Strain: High Risk  ? Difficulty of Paying Living Expenses: Hard  ?Food Insecurity: No Food Insecurity  ? Worried About Charity fundraiser in the Last Year: Never true  ? Ran Out of Food in the Last Year: Never true  ?Transportation Needs: Unmet Transportation Needs  ? Lack of Transportation (Medical): Yes  ? Lack of Transportation (Non-Medical): Yes  ?Physical Activity: Inactive  ? Days of Exercise per Week: 0 days  ? Minutes of Exercise per Session: 0 min  ?Stress: Stress Concern Present  ? Feeling of Stress : To some extent  ?Social Connections: Socially Isolated  ? Frequency of Communication with Friends and Family: More than three times a week  ? Frequency of Social Gatherings with Friends and Family: More than three times a week  ? Attends Religious Services: Never  ? Active Member of Clubs or Organizations: No  ? Attends Archivist Meetings:  Never  ? Marital Status: Divorced  ?Intimate Partner Violence: Not At Risk  ? Fear of Current or Ex-Partner: No  ? Emotionally Abused: No  ? Physically Abused: No  ? Sexually Abused: No  ? ?Family History  ?Problem Relation Age of Onset  ? Heart disease Mother   ? Arthritis Mother   ? Dementia Mother   ? Graves' disease Father   ? Healthy Daughter   ? Healthy Son   ? Graves' disease Paternal Grandmother   ? Clotting disorder Brother   ? Heart disease Brother   ? Colon cancer Neg Hx   ? Colon polyps Neg Hx   ? Esophageal cancer Neg Hx   ? Rectal cancer Neg Hx   ? Stomach cancer Neg Hx   ? ? ?Objective: ?Office vital signs reviewed. ?BP (!) 151/84   Pulse (!) 108   Temp 98.2 ?F (36.8 ?C)  (Temporal)   Ht 5' 3"  (1.6 m)   Wt 116 lb (52.6 kg)   BMI 20.55 kg/m?  ? ?Physical Examination:  ?General: Awake, alert, well nourished, No acute distress ?HEENT: No except thalmus.  No goiter ?Cardio: regular rate and rhythm, S1S2 heard, no murmurs appreciated ?Pulm: clear to auscultation bilaterally, no wheezes, rhonchi or rales; normal work of breathing on room air ?Psych: Mood stable, speech normal, affect appropriate ? ?Assessment/ Plan: ?56 y.o. female  ? ?Psychophysiological insomnia - Plan: hydrOXYzine (ATARAX) 25 MG tablet ? ?Anxiety and depression - Plan: hydrOXYzine (ATARAX) 25 MG tablet, mirtazapine (REMERON) 45 MG tablet, TSH, T4, Free, CMP14+EGFR ? ?Dermatitis - Plan: ketoconazole (NIZORAL) 2 % shampoo ? ?Postablative hypothyroidism - Plan: TSH, T4, Free ? ?Atarax sent in for sleep.  Discussed how to use.  Continue mirtazapine at bedtime.  Check TSH, free T4 given known history of hypothyroidism. ? ?For the dermatitis at her nape of her neck I am going to treat with ketoconazole shampoo.  Home care instructions were reviewed with the patient and reasons for reevaluation discussed.  Follow-up as needed or in 6 months for thyroid check ? ?No orders of the defined types were placed in this encounter. ? ?No orders of the defined types were placed in this encounter. ? ? ? ?Janora Norlander, DO ?[place] ?[phone] ? ?

## 2021-05-05 LAB — CMP14+EGFR
ALT: 9 IU/L (ref 0–32)
AST: 12 IU/L (ref 0–40)
Albumin/Globulin Ratio: 1.9 (ref 1.2–2.2)
Albumin: 4.4 g/dL (ref 3.8–4.9)
Alkaline Phosphatase: 75 IU/L (ref 44–121)
BUN/Creatinine Ratio: 18 (ref 9–23)
BUN: 20 mg/dL (ref 6–24)
Bilirubin Total: 0.4 mg/dL (ref 0.0–1.2)
CO2: 20 mmol/L (ref 20–29)
Calcium: 9.4 mg/dL (ref 8.7–10.2)
Chloride: 102 mmol/L (ref 96–106)
Creatinine, Ser: 1.09 mg/dL — ABNORMAL HIGH (ref 0.57–1.00)
Globulin, Total: 2.3 g/dL (ref 1.5–4.5)
Glucose: 115 mg/dL — ABNORMAL HIGH (ref 70–99)
Potassium: 3.8 mmol/L (ref 3.5–5.2)
Sodium: 141 mmol/L (ref 134–144)
Total Protein: 6.7 g/dL (ref 6.0–8.5)
eGFR: 60 mL/min/{1.73_m2} (ref >59)

## 2021-05-05 LAB — TSH: TSH: 0.443 u[IU]/mL — ABNORMAL LOW (ref 0.450–4.500)

## 2021-05-05 LAB — T4, FREE: Free T4: 1.64 ng/dL (ref 0.82–1.77)

## 2021-05-28 ENCOUNTER — Encounter: Payer: Self-pay | Admitting: Family Medicine

## 2021-05-29 ENCOUNTER — Other Ambulatory Visit: Payer: Self-pay | Admitting: Family Medicine

## 2021-09-05 ENCOUNTER — Other Ambulatory Visit: Payer: Self-pay | Admitting: Family Medicine

## 2021-09-05 DIAGNOSIS — F5104 Psychophysiologic insomnia: Secondary | ICD-10-CM

## 2021-09-05 DIAGNOSIS — F419 Anxiety disorder, unspecified: Secondary | ICD-10-CM

## 2021-11-07 ENCOUNTER — Encounter: Payer: Self-pay | Admitting: Family Medicine

## 2021-11-07 MED ORDER — LEVOTHYROXINE SODIUM 75 MCG PO TABS: ORAL_TABLET | ORAL | 0 refills | Status: DC

## 2021-11-07 NOTE — Telephone Encounter (Signed)
Ok to extend rx to next visit.

## 2021-11-25 IMAGING — US US RENAL
1 series · 14 of 25 positions shown · non-contrast
Comparison: None.

CLINICAL DATA: Chronic kidney disease.

EXAM:
RENAL / URINARY TRACT ULTRASOUND COMPLETE

[Series 1: us renal · 0.18mm/px · 14 of 86 slices shown]
[im 1/86]
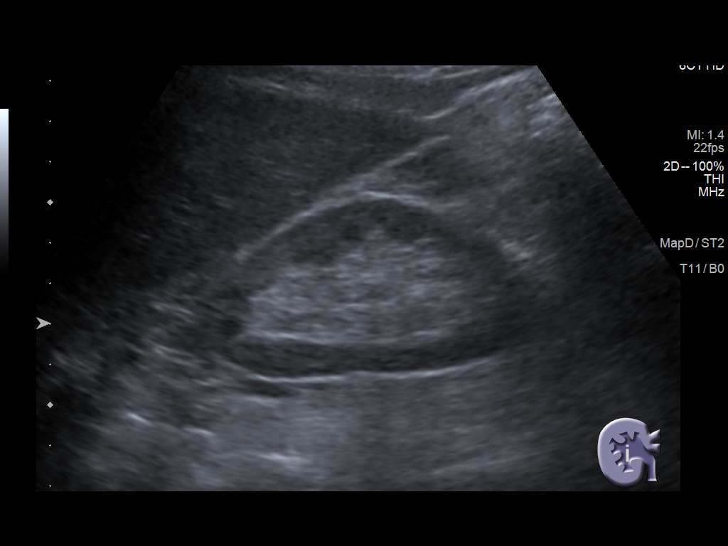
[im 8/86]
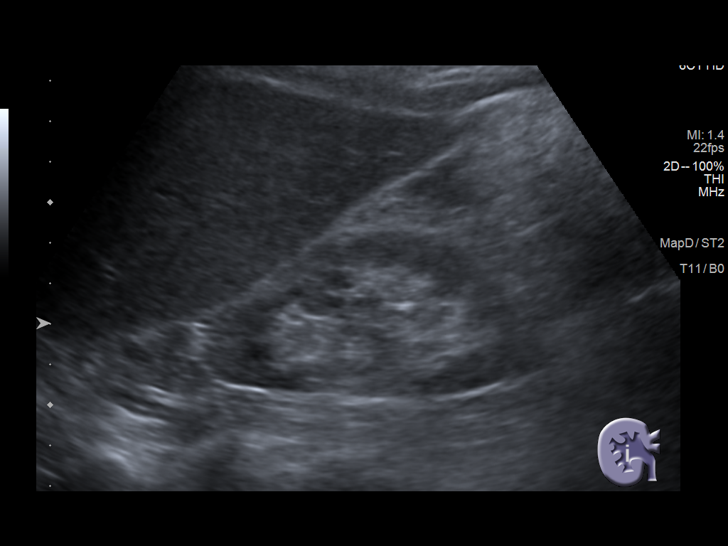
[im 15/86]
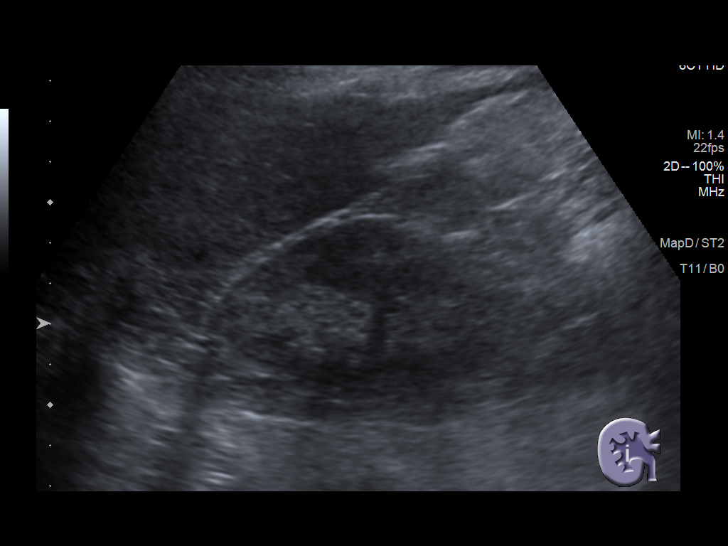
[im 22/86]
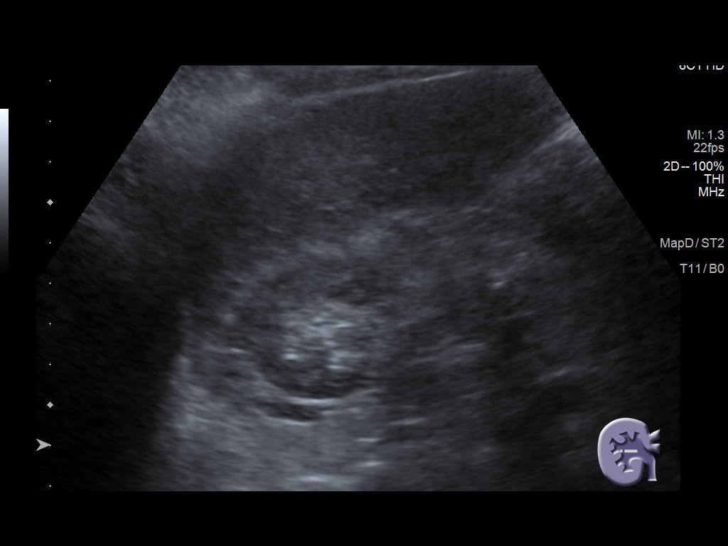
[im 29/86]
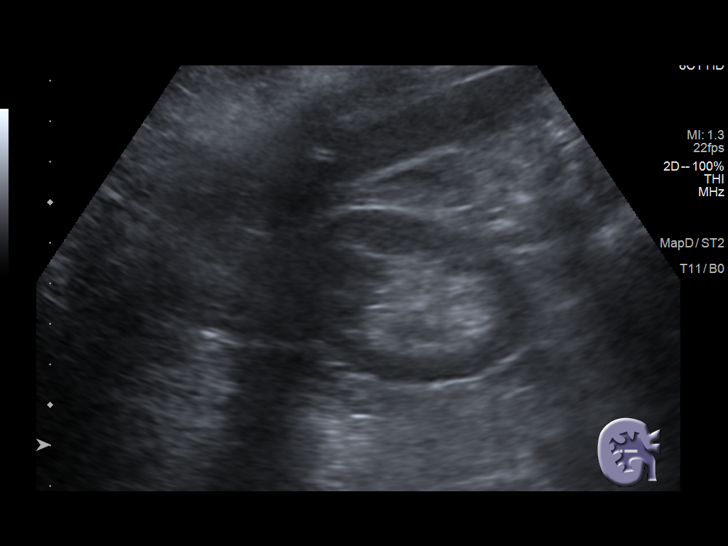
[im 32/86]
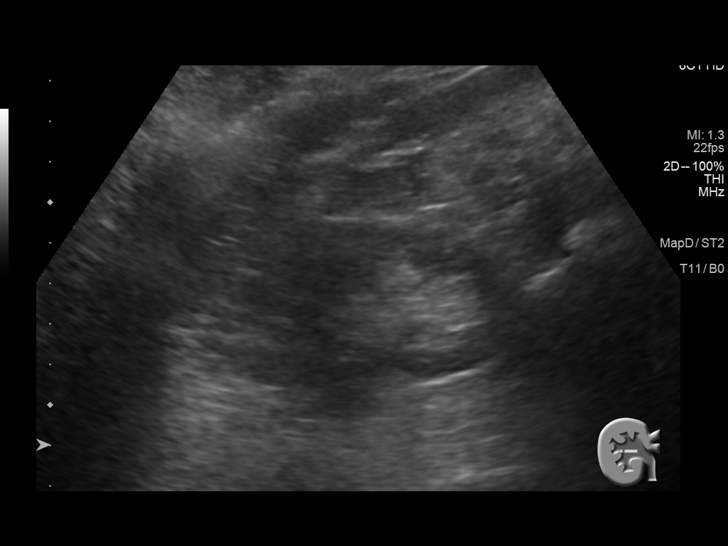
[im 39/86]
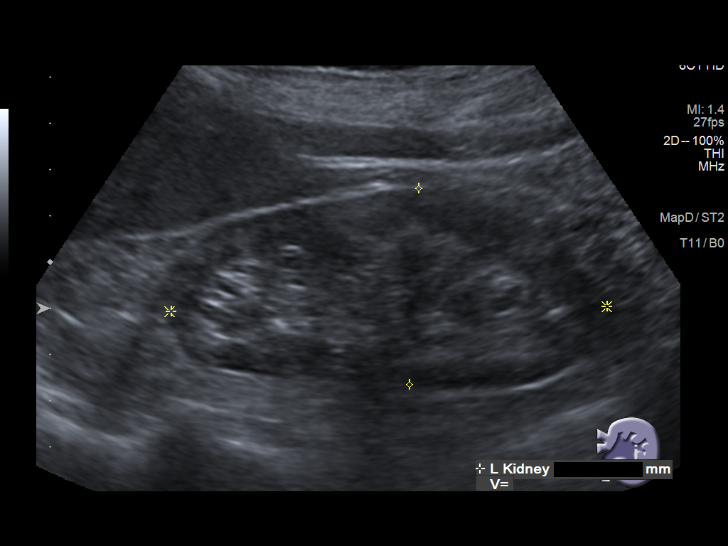
[im 47/86]
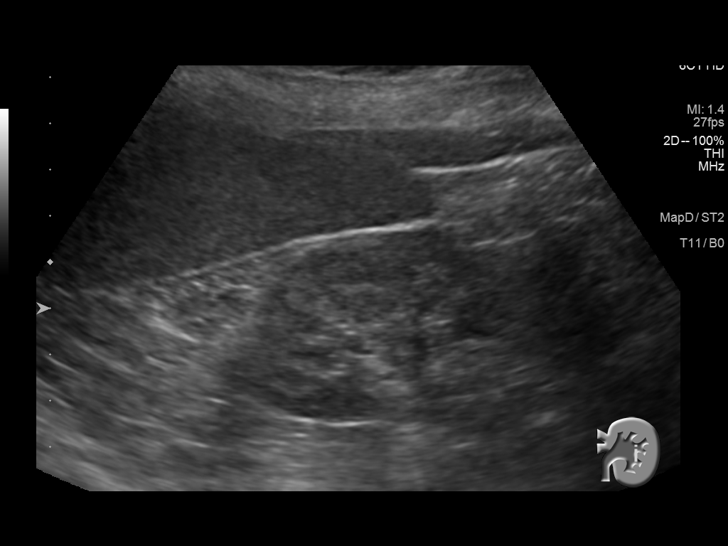
[im 54/86]
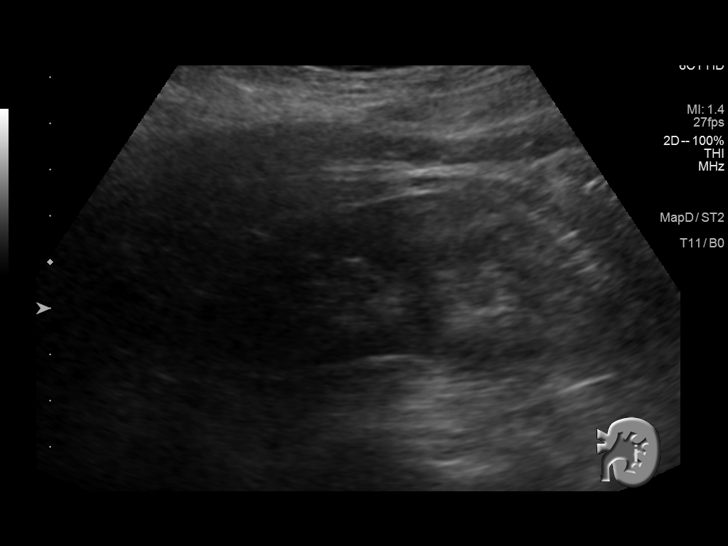
[im 57/86]
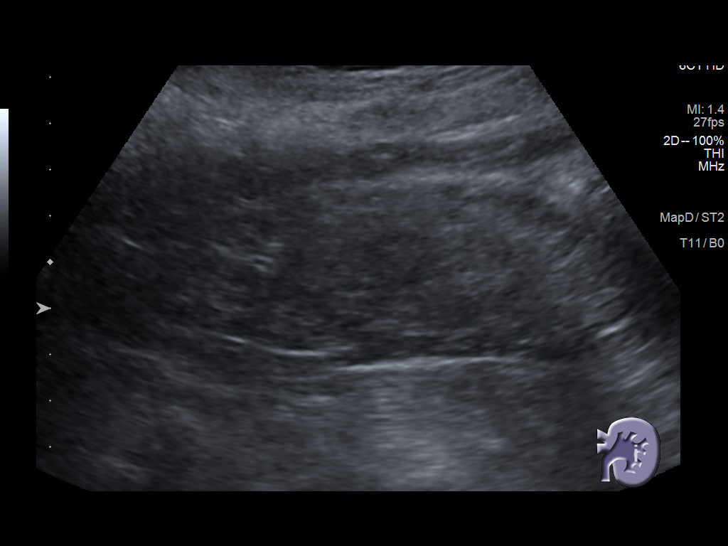
[im 64/86]
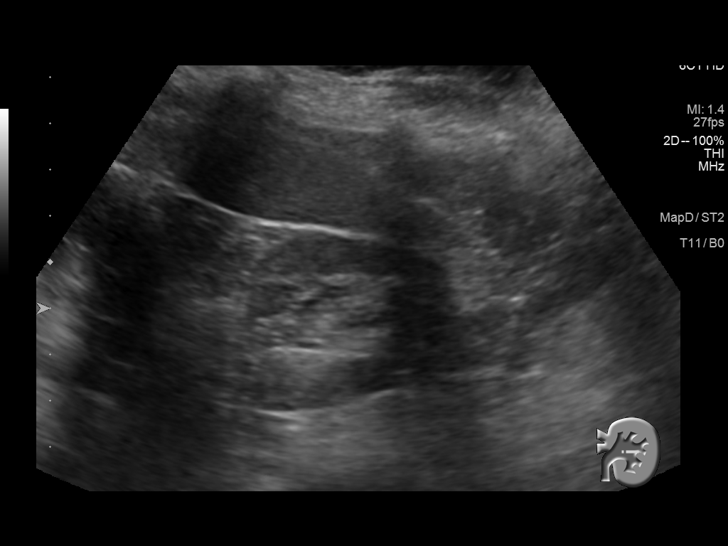
[im 71/86]
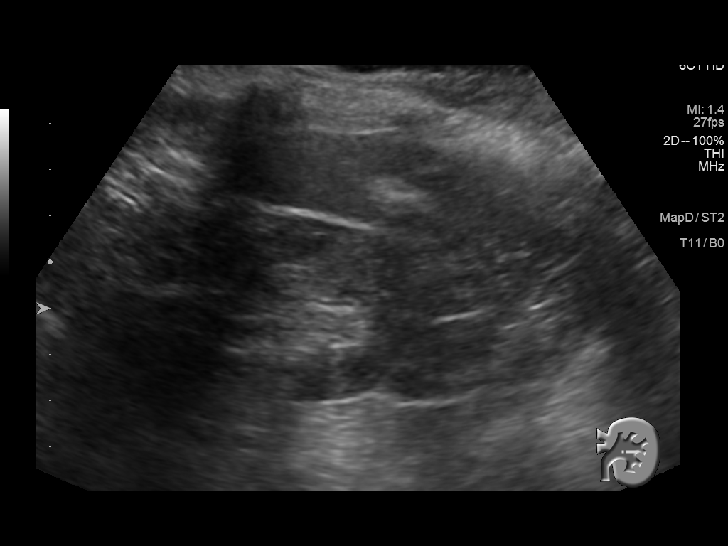
[im 78/86]
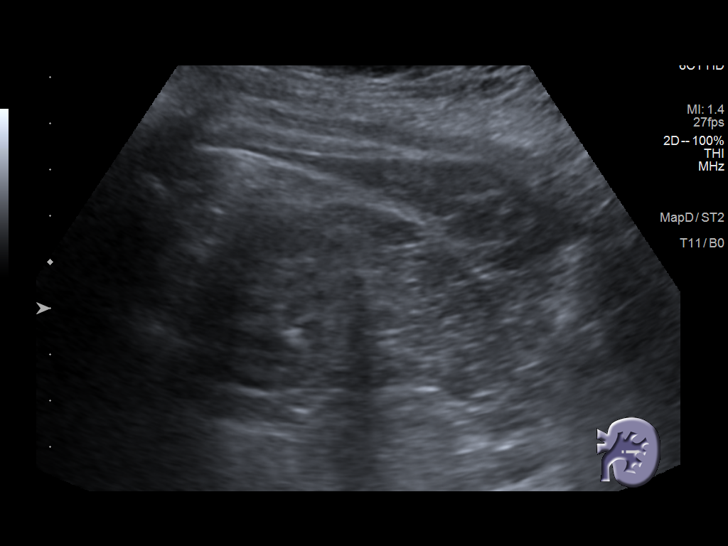
[im 86/86]
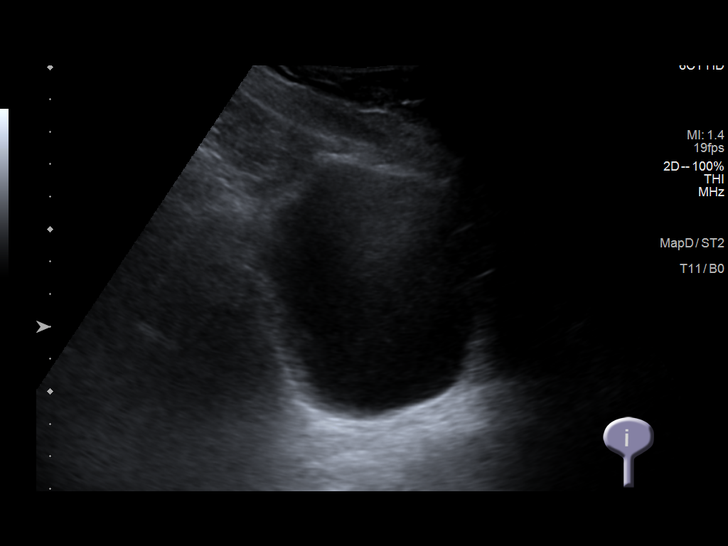

[14 of 25 positions shown; findings below may reference images not displayed]

FINDINGS: Right Kidney:

Renal measurements: 8.5 x 4.3 x 4.0 cm = volume: 76 mL. Echogenicity
is increased. No mass or hydronephrosis visualized.

Left Kidney:

Renal measurements: 9.5 x 4.2 x 4.2 cm = volume: 88 mL. Echogenicity
is increased. No mass or hydronephrosis visualized.

Bladder:

Appears normal for degree of bladder distention.

Other:

None.
IMPRESSION: Increased echogenicity of the bilateral kidneys as can be seen in
medical renal disease. No acute finding.

## 2021-12-04 ENCOUNTER — Encounter: Payer: Self-pay | Admitting: Family Medicine

## 2021-12-04 ENCOUNTER — Other Ambulatory Visit: Payer: Self-pay

## 2021-12-04 MED ORDER — LEVOTHYROXINE SODIUM 75 MCG PO TABS: ORAL_TABLET | ORAL | 0 refills | Status: DC

## 2021-12-04 NOTE — Telephone Encounter (Signed)
She will need to schedule a ride with [employer] but she has to be seen. Ok to provide 1 week of meds to give her time to coordinate with the bus system

## 2021-12-05 ENCOUNTER — Encounter (INDEPENDENT_AMBULATORY_CARE_PROVIDER_SITE_OTHER): Payer: Medicare HMO

## 2021-12-10 DIAGNOSIS — E039 Hypothyroidism, unspecified: Secondary | ICD-10-CM | POA: Diagnosis not present

## 2021-12-10 DIAGNOSIS — R69 Illness, unspecified: Secondary | ICD-10-CM | POA: Diagnosis not present

## 2021-12-10 DIAGNOSIS — E038 Other specified hypothyroidism: Secondary | ICD-10-CM | POA: Diagnosis not present

## 2021-12-14 ENCOUNTER — Other Ambulatory Visit: Payer: Self-pay | Admitting: Family Medicine

## 2021-12-19 ENCOUNTER — Ambulatory Visit: Payer: Medicare HMO | Admitting: Family Medicine

## 2021-12-24 ENCOUNTER — Encounter: Payer: Self-pay | Admitting: Family Medicine

## 2022-01-08 ENCOUNTER — Other Ambulatory Visit: Payer: Self-pay | Admitting: Family Medicine

## 2022-01-08 NOTE — Telephone Encounter (Signed)
[place] NTBS 30 days given 12/14/21

## 2022-03-03 DIAGNOSIS — J1189 Influenza due to unidentified influenza virus with other manifestations: Secondary | ICD-10-CM | POA: Diagnosis not present

## 2022-07-26 DIAGNOSIS — E890 Postprocedural hypothyroidism: Secondary | ICD-10-CM | POA: Diagnosis not present

## 2022-07-26 DIAGNOSIS — F411 Generalized anxiety disorder: Secondary | ICD-10-CM | POA: Diagnosis not present

## 2022-07-26 DIAGNOSIS — Z1330 Encounter for screening examination for mental health and behavioral disorders, unspecified: Secondary | ICD-10-CM | POA: Diagnosis not present

## 2022-07-26 DIAGNOSIS — R636 Underweight: Secondary | ICD-10-CM | POA: Diagnosis not present

## 2022-07-26 DIAGNOSIS — R29898 Other symptoms and signs involving the musculoskeletal system: Secondary | ICD-10-CM | POA: Diagnosis not present

## 2022-09-09 DIAGNOSIS — E890 Postprocedural hypothyroidism: Secondary | ICD-10-CM | POA: Diagnosis not present

## 2022-09-09 DIAGNOSIS — Z0000 Encounter for general adult medical examination without abnormal findings: Secondary | ICD-10-CM | POA: Diagnosis not present

## 2022-09-09 DIAGNOSIS — R636 Underweight: Secondary | ICD-10-CM | POA: Diagnosis not present

## 2022-09-09 DIAGNOSIS — N1831 Chronic kidney disease, stage 3a: Secondary | ICD-10-CM | POA: Diagnosis not present

## 2022-09-09 DIAGNOSIS — F17210 Nicotine dependence, cigarettes, uncomplicated: Secondary | ICD-10-CM | POA: Diagnosis not present

## 2022-09-09 DIAGNOSIS — Z13220 Encounter for screening for lipoid disorders: Secondary | ICD-10-CM | POA: Diagnosis not present

## 2022-09-09 DIAGNOSIS — Z5320 Procedure and treatment not carried out because of patient's decision for unspecified reasons: Secondary | ICD-10-CM | POA: Diagnosis not present

## 2022-10-30 DIAGNOSIS — F411 Generalized anxiety disorder: Secondary | ICD-10-CM | POA: Diagnosis not present

## 2022-10-30 DIAGNOSIS — F325 Major depressive disorder, single episode, in full remission: Secondary | ICD-10-CM | POA: Diagnosis not present

## 2022-10-30 DIAGNOSIS — I129 Hypertensive chronic kidney disease with stage 1 through stage 4 chronic kidney disease, or unspecified chronic kidney disease: Secondary | ICD-10-CM | POA: Diagnosis not present

## 2022-10-30 DIAGNOSIS — J302 Other seasonal allergic rhinitis: Secondary | ICD-10-CM | POA: Diagnosis not present

## 2022-10-30 DIAGNOSIS — F17210 Nicotine dependence, cigarettes, uncomplicated: Secondary | ICD-10-CM | POA: Diagnosis not present

## 2022-10-30 DIAGNOSIS — N189 Chronic kidney disease, unspecified: Secondary | ICD-10-CM | POA: Diagnosis not present

## 2022-10-30 DIAGNOSIS — K08409 Partial loss of teeth, unspecified cause, unspecified class: Secondary | ICD-10-CM | POA: Diagnosis not present

## 2022-10-30 DIAGNOSIS — R636 Underweight: Secondary | ICD-10-CM | POA: Diagnosis not present

## 2022-10-30 DIAGNOSIS — Z681 Body mass index (BMI) 19 or less, adult: Secondary | ICD-10-CM | POA: Diagnosis not present

## 2022-10-30 DIAGNOSIS — E890 Postprocedural hypothyroidism: Secondary | ICD-10-CM | POA: Diagnosis not present

## 2022-12-20 DIAGNOSIS — F411 Generalized anxiety disorder: Secondary | ICD-10-CM | POA: Diagnosis not present

## 2022-12-20 DIAGNOSIS — L309 Dermatitis, unspecified: Secondary | ICD-10-CM | POA: Diagnosis not present

## 2022-12-20 DIAGNOSIS — R636 Underweight: Secondary | ICD-10-CM | POA: Diagnosis not present

## 2022-12-20 DIAGNOSIS — E890 Postprocedural hypothyroidism: Secondary | ICD-10-CM | POA: Diagnosis not present

## 2022-12-20 DIAGNOSIS — N1831 Chronic kidney disease, stage 3a: Secondary | ICD-10-CM | POA: Diagnosis not present

## 2022-12-20 DIAGNOSIS — G4700 Insomnia, unspecified: Secondary | ICD-10-CM | POA: Diagnosis not present

## 2023-09-09 DIAGNOSIS — N1831 Chronic kidney disease, stage 3a: Secondary | ICD-10-CM | POA: Diagnosis not present

## 2023-09-09 DIAGNOSIS — R636 Underweight: Secondary | ICD-10-CM | POA: Diagnosis not present

## 2023-09-09 DIAGNOSIS — E890 Postprocedural hypothyroidism: Secondary | ICD-10-CM | POA: Diagnosis not present

## 2024-03-31 NOTE — Progress Notes (Signed)
 Roberta Brooks                                          MRN: [id number]   03/31/2024   The [facility] Quality Team Specialist reviewed this patient medical record for the purposes of chart review for care gap closure. The following were reviewed: chart review for care gap closure-breast cancer screening.    [facility] Quality Team
